# Patient Record
Sex: Female | Born: 1965 | Hispanic: Yes | Marital: Married | Smoking: Never smoker
Health system: Southern US, Community
[De-identification: ages and names within clinical notes are randomized; demographics above are authoritative.]

## PROBLEM LIST (undated history)

## (undated) DIAGNOSIS — F419 Anxiety disorder, unspecified: Secondary | ICD-10-CM

## (undated) DIAGNOSIS — I10 Essential (primary) hypertension: Secondary | ICD-10-CM

## (undated) DIAGNOSIS — Z309 Encounter for contraceptive management, unspecified: Secondary | ICD-10-CM

## (undated) DIAGNOSIS — E559 Vitamin D deficiency, unspecified: Secondary | ICD-10-CM

## (undated) HISTORY — DX: Essential (primary) hypertension: I10

## (undated) HISTORY — DX: Encounter for contraceptive management, unspecified: Z30.9

## (undated) HISTORY — DX: Anxiety disorder, unspecified: F41.9

## (undated) HISTORY — PX: OTHER SURGICAL HISTORY: SHX169

## (undated) HISTORY — DX: Vitamin D deficiency, unspecified: E55.9

---

## 2007-10-04 ENCOUNTER — Other Ambulatory Visit: Admission: RE | Admit: 2007-10-04 | Discharge: 2007-10-04 | Payer: Self-pay | Admitting: Obstetrics and Gynecology

## 2009-02-05 ENCOUNTER — Other Ambulatory Visit: Admission: RE | Admit: 2009-02-05 | Discharge: 2009-02-05 | Payer: Self-pay | Admitting: Obstetrics and Gynecology

## 2010-05-12 ENCOUNTER — Encounter: Payer: Self-pay | Admitting: Obstetrics & Gynecology

## 2011-01-23 ENCOUNTER — Other Ambulatory Visit: Payer: Self-pay | Admitting: Obstetrics and Gynecology

## 2011-02-07 ENCOUNTER — Other Ambulatory Visit: Payer: Self-pay | Admitting: Obstetrics and Gynecology

## 2011-02-07 ENCOUNTER — Other Ambulatory Visit (HOSPITAL_COMMUNITY)
Admission: RE | Admit: 2011-02-07 | Discharge: 2011-02-07 | Disposition: A | Discharge status: Home / Self Care | Payer: BC Managed Care – PPO | Source: Ambulatory Visit | Attending: Obstetrics and Gynecology | Admitting: Obstetrics and Gynecology

## 2011-02-07 DIAGNOSIS — Z01419 Encounter for gynecological examination (general) (routine) without abnormal findings: Secondary | ICD-10-CM | POA: Insufficient documentation

## 2011-02-07 DIAGNOSIS — Z139 Encounter for screening, unspecified: Secondary | ICD-10-CM

## 2011-03-03 ENCOUNTER — Ambulatory Visit (HOSPITAL_COMMUNITY)
Admission: RE | Admit: 2011-03-03 | Discharge: 2011-03-03 | Disposition: A | Discharge status: Home / Self Care | Payer: BC Managed Care – PPO | Source: Ambulatory Visit | Attending: Obstetrics and Gynecology | Admitting: Obstetrics and Gynecology

## 2011-03-03 DIAGNOSIS — Z1231 Encounter for screening mammogram for malignant neoplasm of breast: Secondary | ICD-10-CM | POA: Insufficient documentation

## 2011-03-03 DIAGNOSIS — Z139 Encounter for screening, unspecified: Secondary | ICD-10-CM

## 2012-03-22 ENCOUNTER — Other Ambulatory Visit: Payer: Self-pay | Admitting: Adult Health

## 2012-03-22 DIAGNOSIS — Z139 Encounter for screening, unspecified: Secondary | ICD-10-CM

## 2012-03-30 ENCOUNTER — Other Ambulatory Visit: Payer: Self-pay | Admitting: Adult Health

## 2012-03-30 ENCOUNTER — Other Ambulatory Visit (HOSPITAL_COMMUNITY)
Admission: RE | Admit: 2012-03-30 | Discharge: 2012-03-30 | Disposition: A | Discharge status: Home / Self Care | Payer: BC Managed Care – PPO | Source: Ambulatory Visit | Attending: Obstetrics and Gynecology | Admitting: Obstetrics and Gynecology

## 2012-03-30 ENCOUNTER — Ambulatory Visit (HOSPITAL_COMMUNITY)
Admission: RE | Admit: 2012-03-30 | Discharge: 2012-03-30 | Disposition: A | Discharge status: Home / Self Care | Payer: BC Managed Care – PPO | Source: Ambulatory Visit | Attending: Adult Health | Admitting: Adult Health

## 2012-03-30 DIAGNOSIS — Z139 Encounter for screening, unspecified: Secondary | ICD-10-CM

## 2012-03-30 DIAGNOSIS — Z01419 Encounter for gynecological examination (general) (routine) without abnormal findings: Secondary | ICD-10-CM | POA: Insufficient documentation

## 2012-03-30 DIAGNOSIS — Z1231 Encounter for screening mammogram for malignant neoplasm of breast: Secondary | ICD-10-CM | POA: Insufficient documentation

## 2012-03-30 DIAGNOSIS — Z1151 Encounter for screening for human papillomavirus (HPV): Secondary | ICD-10-CM | POA: Insufficient documentation

## 2012-08-11 ENCOUNTER — Telehealth: Payer: Self-pay | Admitting: Adult Health

## 2012-08-11 MED ORDER — NORGESTIMATE-ETH ESTRADIOL 0.25-35 MG-MCG PO TABS: 1.0000 | ORAL_TABLET | Freq: Every day | ORAL | Status: DC

## 2012-08-11 NOTE — Telephone Encounter (Signed)
Wants cheaper birth control(taking loestrin) will try sprintec, sent Rx to [facility] in [place]

## 2013-04-08 ENCOUNTER — Other Ambulatory Visit: Payer: Self-pay | Admitting: Adult Health

## 2013-04-08 DIAGNOSIS — Z139 Encounter for screening, unspecified: Secondary | ICD-10-CM

## 2013-04-11 ENCOUNTER — Encounter: Payer: Self-pay | Admitting: Adult Health

## 2013-04-11 ENCOUNTER — Ambulatory Visit (INDEPENDENT_AMBULATORY_CARE_PROVIDER_SITE_OTHER): Payer: BC Managed Care – PPO | Admitting: Adult Health

## 2013-04-11 ENCOUNTER — Ambulatory Visit (HOSPITAL_COMMUNITY)
Admission: RE | Admit: 2013-04-11 | Discharge: 2013-04-11 | Disposition: A | Discharge status: Home / Self Care | Payer: BC Managed Care – PPO | Source: Ambulatory Visit | Attending: Adult Health | Admitting: Adult Health

## 2013-04-11 VITALS — BP 118/60 | HR 72 | Ht 66.0 in | Wt 161.5 lb

## 2013-04-11 DIAGNOSIS — Z139 Encounter for screening, unspecified: Secondary | ICD-10-CM

## 2013-04-11 DIAGNOSIS — F419 Anxiety disorder, unspecified: Secondary | ICD-10-CM

## 2013-04-11 DIAGNOSIS — Z309 Encounter for contraceptive management, unspecified: Secondary | ICD-10-CM

## 2013-04-11 DIAGNOSIS — Z1231 Encounter for screening mammogram for malignant neoplasm of breast: Secondary | ICD-10-CM | POA: Insufficient documentation

## 2013-04-11 DIAGNOSIS — Z1212 Encounter for screening for malignant neoplasm of rectum: Secondary | ICD-10-CM

## 2013-04-11 DIAGNOSIS — Z01419 Encounter for gynecological examination (general) (routine) without abnormal findings: Secondary | ICD-10-CM

## 2013-04-11 HISTORY — DX: Encounter for contraceptive management, unspecified: Z30.9

## 2013-04-11 HISTORY — DX: Anxiety disorder, unspecified: F41.9

## 2013-04-11 LAB — HEMOCCULT GUIAC POC 1CARD (OFFICE): Fecal Occult Blood, POC: NEGATIVE

## 2013-04-11 MED ORDER — SERTRALINE HCL 50 MG PO TABS: ORAL_TABLET | ORAL | Status: DC

## 2013-04-11 MED ORDER — NORGESTIMATE-ETH ESTRADIOL 0.25-35 MG-MCG PO TABS: 1.0000 | ORAL_TABLET | Freq: Every day | ORAL | Status: DC

## 2013-04-11 NOTE — Progress Notes (Signed)
Patient ID: Samantha [name], female   DOB: [birth year], 47 y.o.   MRN: [id number] History of Present Illness: Samantha [name] is a 47 year old Hispanic female married in for a physical.She had a normal pap with negative HPV 03/30/12.Happy with sprintec.Got flu shot at work and had labs at work,and they were reviewed with her.   Current Medications, Allergies, Past Medical History, Past Surgical History, Family History and Social History were reviewed in [employer] record.   Past Medical History  Diagnosis Date  . Anxiety   . Contraception management 04/11/2013  . Anxiety 04/11/2013  Current outpatient prescriptions:norgestimate-ethinyl estradiol (ORTHO-CYCLEN,SPRINTEC,PREVIFEM) 0.25-35 MG-MCG tablet, Take 1 tablet by mouth daily., Disp: 1 Package, Rfl: 11;  sertraline (ZOLOFT) 50 MG tablet, Takes 1.5 pills daily, Disp: 45 tablet, Rfl: 11  Past Surgical History  Procedure Laterality Date  . Veins      Review of Systems: Patient denies any headaches, blurred vision, shortness of breath, chest pain, abdominal pain, problems with bowel movements, urination, or intercourse. No joint pain or mood swings, on zoloft.    Physical Exam:BP 118/60  Pulse 72  Ht 5\' 6"  (1.676 m)  Wt 161 lb 8 oz (73.256 kg)  BMI 26.08 kg/m2  LMP 03/17/2013 General:  Well developed, well nourished, no acute distress Skin:  Warm and dry Neck:  Midline trachea, normal thyroid Lungs; Clear to auscultation bilaterally Breast:  No dominant palpable mass, retraction, or nipple discharge,had mammogram this am Cardiovascular: Regular rate and rhythm Abdomen:  Soft, non tender, no hepatosplenomegaly Pelvic:  External genitalia is normal in appearance.  The vagina is normal in appearance.  The cervix is bulbous and everted at os..  Uterus is felt to be normal size, shape, and contour.  No adnexal masses or tenderness noted. Rectal: Good sphincter tone, no polyps,internal and external hemorrhoids felt.  Hemoccult  negative. Extremities:  No swelling or varicosities noted Psych:  No mood changes,alert and cooperative,seems happy,works at health dept.   Impression: Yearly gyn exam no pap Contraceptive management Anxiety     Plan: Refilled sprintec x 1 year  Refilled zoloft 50 mg 1.5 daily x 1 year Physical in 1 year Mammogram yearly  Labs at work Colonoscopy at 48.

## 2013-04-11 NOTE — Patient Instructions (Signed)
Physical in 1 year Mammogram yearly Labs at work Colonoscopy at 48 

## 2014-02-20 ENCOUNTER — Encounter: Payer: Self-pay | Admitting: Adult Health

## 2014-04-17 ENCOUNTER — Other Ambulatory Visit: Payer: Self-pay | Admitting: Adult Health

## 2014-04-17 ENCOUNTER — Telehealth: Payer: Self-pay | Admitting: *Deleted

## 2014-04-17 NOTE — Telephone Encounter (Signed)
Pt requesting a refill on OCP. Pt informed Cyril MourningJennifer Griffin, NP out of office today and pt has an appt with her tomorrow at 3 pm. Pt states will discuss refill with Cyril MourningJennifer Griffin, NP tomorrow at her appt.

## 2014-04-18 ENCOUNTER — Ambulatory Visit (INDEPENDENT_AMBULATORY_CARE_PROVIDER_SITE_OTHER): Payer: BC Managed Care – PPO | Admitting: Adult Health

## 2014-04-18 ENCOUNTER — Encounter: Payer: Self-pay | Admitting: Adult Health

## 2014-04-18 VITALS — BP 132/64 | HR 74 | Ht 65.0 in | Wt 167.0 lb

## 2014-04-18 DIAGNOSIS — Z1212 Encounter for screening for malignant neoplasm of rectum: Secondary | ICD-10-CM

## 2014-04-18 DIAGNOSIS — Z01419 Encounter for gynecological examination (general) (routine) without abnormal findings: Secondary | ICD-10-CM

## 2014-04-18 DIAGNOSIS — Z139 Encounter for screening, unspecified: Secondary | ICD-10-CM

## 2014-04-18 DIAGNOSIS — F419 Anxiety disorder, unspecified: Secondary | ICD-10-CM

## 2014-04-18 DIAGNOSIS — Z3041 Encounter for surveillance of contraceptive pills: Secondary | ICD-10-CM

## 2014-04-18 LAB — HEMOCCULT GUIAC POC 1CARD (OFFICE): FECAL OCCULT BLD: NEGATIVE

## 2014-04-18 MED ORDER — NORGESTIMATE-ETH ESTRADIOL 0.25-35 MG-MCG PO TABS: 1.0000 | ORAL_TABLET | Freq: Every day | ORAL | Status: DC

## 2014-04-18 MED ORDER — SERTRALINE HCL 100 MG PO TABS: 100.0000 mg | ORAL_TABLET | Freq: Every day | ORAL | Status: DC

## 2014-04-18 NOTE — Patient Instructions (Signed)
Pap and physical  Mammogram yearly Colonoscopy advised referred to Dr Burton Apleyehman Labs at work

## 2014-04-18 NOTE — Progress Notes (Signed)
Patient ID: Samantha [name], female   DOB: [birth year], 48 y.o.   MRN: [id number] History of Present Illness: Byrd HesselbachMaria is a 48 year old Hispanic female in for gyn exam.She had a normal pap with negative HPV 03/30/12.She has had  to take 2 zoloft and wants dose increased.Happy with OCs,having some hot flashes.   Current Medications, Allergies, Past Medical History, Past Surgical History, Family History and Social History were reviewed in [facility] electronic medical record.     Review of Systems: Patient denies any daily headaches, blurred vision, shortness of breath, chest pain, abdominal pain, problems with bowel movements, urination, or intercourse. No joint pain, see HPI.    Physical Exam:BP 132/64 mmHg  Pulse 74  Ht 5\' 5"  (1.651 m)  Wt 167 lb (75.751 kg)  BMI 27.79 kg/m2  LMP 04/13/2014 General:  Well developed, well nourished, no acute distress Skin:  Warm and dry Neck:  Midline trachea, normal thyroid Lungs; Clear to auscultation bilaterally Breast:  No dominant palpable mass, retraction, or nipple discharge Cardiovascular: Regular rate and rhythm Abdomen:  Soft, non tender, no hepatosplenomegaly Pelvic:  External genitalia is normal in appearance.  The vagina is normal in appearance, with some period like blood. The cervix is bulbous.  Uterus is felt to be normal size, shape, and contour.  No           adnexal masses or tenderness noted. Rectal: Good sphincter tone, no polyps, internal hemorrhoids felt and has external one.  Hemoccult negative. Extremities:  No swelling or varicosities noted Psych:  Alert and cooperative,seems happy, still working at [employer] de[employer]. And she works out regularly   Impression: Well woman gyn exam no pap Contraceptive management Anxiety     Plan: Rx zoloft 100 mg #90 1 daily with 11 refills Rx sprintec x 1 year Pap and physical in 1 year Mammogram yearly Referred to Dr Karilyn Cotaehman for screening colonoscopy Labs at work and they are good

## 2014-04-19 ENCOUNTER — Encounter (INDEPENDENT_AMBULATORY_CARE_PROVIDER_SITE_OTHER): Payer: Self-pay | Admitting: *Deleted

## 2014-07-12 ENCOUNTER — Ambulatory Visit (INDEPENDENT_AMBULATORY_CARE_PROVIDER_SITE_OTHER): Payer: BLUE CROSS/BLUE SHIELD | Admitting: Family Medicine

## 2014-07-12 ENCOUNTER — Encounter: Payer: Self-pay | Admitting: Family Medicine

## 2014-07-12 VITALS — BP 130/78 | HR 75 | Temp 97.5°F | Ht 65.5 in | Wt 169.4 lb

## 2014-07-12 DIAGNOSIS — F411 Generalized anxiety disorder: Secondary | ICD-10-CM | POA: Diagnosis not present

## 2014-07-12 MED ORDER — PHENTERMINE HCL 37.5 MG PO CAPS: 37.5000 mg | ORAL_CAPSULE | ORAL | Status: DC

## 2014-07-12 NOTE — Progress Notes (Signed)
Subjective:  Patient ID: Samantha [name], female    DOB: 11/17/1965  Age: 49 y.o. MRN: [id number]  CC: Establish Care and Weight Loss BMI 29 this AM.   HPI Samantha [name] presents for need to lose weight. She is 49 years old and overweight and concerned about risk in the future for diabetes and hypertension. She has tried multiple diets without success. She is anxious related to her weight. She is frustrated and has a negative body image this leads to sense of impending doom. It is associated with chest pains and dyspnea.  History Samantha [name] has a past medical history of Anxiety; Contraception management (04/11/2013); and Anxiety (04/11/2013).   She has past surgical history that includes veins.   Her family history includes Arthritis in her father and mother; Cancer in her brother; Hyperlipidemia in her father and mother; Hypertension in her brother, brother, father, and mother.She reports that she has never smoked. She has never used smokeless tobacco. She reports that she does not drink alcohol or use illicit drugs.  Current Outpatient Prescriptions on File Prior to Visit  Medication Sig Dispense Refill  . norgestimate-ethinyl estradiol (SPRINTEC 28) 0.25-35 MG-MCG tablet Take 1 tablet by mouth daily. 28 tablet 11   No current facility-administered medications on file prior to visit.    ROS Review of Systems  Constitutional: Negative for fever, chills, diaphoresis, appetite change, fatigue and unexpected weight change.  HENT: Negative for congestion, ear pain, hearing loss, postnasal drip, rhinorrhea, sneezing, sore throat and trouble swallowing.   Eyes: Negative for pain.  Respiratory: Negative for cough, chest tightness and shortness of breath.   Cardiovascular: Negative for chest pain and palpitations.  Gastrointestinal: Negative for nausea, vomiting, abdominal pain, diarrhea and constipation.  Genitourinary: Negative for dysuria, frequency and menstrual problem.  Musculoskeletal:  Negative for joint swelling and arthralgias.  Skin: Negative for rash.  Neurological: Negative for dizziness, weakness, numbness and headaches.  Psychiatric/Behavioral: Negative for dysphoric mood and agitation.    Objective:  BP 130/78 mmHg  Pulse 75  Temp(Src) 97.5 F (36.4 C) (Oral)  Ht 5' 5.5" (1.664 m)  Wt 169 lb 6.4 oz (76.839 kg)  BMI 27.75 kg/m2  LMP 07/06/2014  Physical Exam  Constitutional: She is oriented to person, place, and time. She appears well-developed and well-nourished. No distress.  HENT:  Head: Normocephalic and atraumatic.  Right Ear: External ear normal.  Left Ear: External ear normal.  Nose: Nose normal.  Mouth/Throat: Oropharynx is clear and moist.  Eyes: Conjunctivae and EOM are normal. Pupils are equal, round, and reactive to light.  Neck: Normal range of motion. Neck supple. No thyromegaly present.  Cardiovascular: Normal rate, regular rhythm and normal heart sounds.   No murmur heard. Pulmonary/Chest: Effort normal and breath sounds normal. No respiratory distress. She has no wheezes. She has no rales.  Abdominal: Soft. Bowel sounds are normal. She exhibits no distension. There is no tenderness.  Lymphadenopathy:    She has no cervical adenopathy.  Neurological: She is alert and oriented to person, place, and time. She has normal reflexes.  Skin: Skin is warm and dry.  Psychiatric: She has a normal mood and affect. Her behavior is normal. Judgment and thought content normal.    Assessment & Plan:   There are no diagnoses linked to this encounter. I have discontinued Ms. Chalfant's multivitamin and sertraline. I am also having her start on phentermine. Additionally, I am having her maintain her norgestimate-ethinyl estradiol.  Meds ordered this encounter  Medications  .  phentermine 37.5 MG capsule    Sig: Take 1 capsule (37.5 mg total) by mouth every morning.    Dispense:  30 capsule    Refill:  2     Follow-up: Return Phamacy appt in 1  month, for weight.  Mechele Claude, M.D.

## 2014-07-12 NOTE — Patient Instructions (Signed)
Follow up with Pharmacy in 1 month

## 2014-07-17 ENCOUNTER — Encounter: Payer: Self-pay | Admitting: Family Medicine

## 2014-08-15 ENCOUNTER — Telehealth: Payer: Self-pay | Admitting: Pharmacist Clinician (PhC)/ Clinical Pharmacy Specialist

## 2014-08-15 NOTE — Telephone Encounter (Signed)
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  Patient is doing well on phentermine with no complications.

## 2014-09-26 ENCOUNTER — Telehealth: Payer: Self-pay | Admitting: Pharmacist Clinician (PhC)/ Clinical Pharmacy Specialist

## 2014-09-26 NOTE — Telephone Encounter (Signed)
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 BP is 122/77 and Pulse is 80.  [facility] [place]

## 2014-11-07 ENCOUNTER — Other Ambulatory Visit: Payer: Self-pay | Admitting: Family Medicine

## 2014-11-07 NOTE — Telephone Encounter (Signed)
Last seen 07/12/14 Dr Darlyn ReadStacks   If approved [facility] [place]  [phone]

## 2014-11-07 NOTE — Telephone Encounter (Signed)
Called patient to tell her we treat phentermine for 3 months on and then 3 months off.  She has completed three months and done very well with her weight loss.  She has excellent exercise and dietary habits.  Encouraged her to continue following them.

## 2014-11-22 ENCOUNTER — Other Ambulatory Visit: Payer: Self-pay | Admitting: Adult Health

## 2014-11-22 DIAGNOSIS — Z1231 Encounter for screening mammogram for malignant neoplasm of breast: Secondary | ICD-10-CM

## 2014-12-22 ENCOUNTER — Ambulatory Visit (HOSPITAL_COMMUNITY)
Admission: RE | Admit: 2014-12-22 | Discharge: 2014-12-22 | Disposition: A | Discharge status: Home / Self Care | Payer: BLUE CROSS/BLUE SHIELD | Source: Ambulatory Visit | Attending: Adult Health | Admitting: Adult Health

## 2014-12-22 DIAGNOSIS — Z1231 Encounter for screening mammogram for malignant neoplasm of breast: Secondary | ICD-10-CM | POA: Insufficient documentation

## 2015-03-06 ENCOUNTER — Telehealth: Payer: Self-pay | Admitting: Family Medicine

## 2015-03-06 NOTE — Telephone Encounter (Signed)
I would be happy to see her for that thanks, WS.

## 2015-03-07 NOTE — Telephone Encounter (Signed)
Left a message, will need to schedule an office visit to discuss refills.

## 2015-03-13 ENCOUNTER — Other Ambulatory Visit: Payer: Self-pay | Admitting: Adult Health

## 2015-04-26 ENCOUNTER — Telehealth: Payer: Self-pay | Admitting: Pharmacist

## 2015-04-26 NOTE — Telephone Encounter (Signed)
Appt made with St[facility] - Patient made aware /lat

## 2015-04-27 ENCOUNTER — Encounter: Payer: Self-pay | Admitting: Adult Health

## 2015-04-27 ENCOUNTER — Ambulatory Visit (INDEPENDENT_AMBULATORY_CARE_PROVIDER_SITE_OTHER): Payer: BLUE CROSS/BLUE SHIELD | Admitting: Adult Health

## 2015-04-27 ENCOUNTER — Other Ambulatory Visit (HOSPITAL_COMMUNITY)
Admission: RE | Admit: 2015-04-27 | Discharge: 2015-04-27 | Disposition: A | Discharge status: Home / Self Care | Payer: BLUE CROSS/BLUE SHIELD | Source: Ambulatory Visit | Attending: Adult Health | Admitting: Adult Health

## 2015-04-27 VITALS — BP 149/79 | HR 68 | Ht 65.0 in | Wt 159.0 lb

## 2015-04-27 DIAGNOSIS — Z1151 Encounter for screening for human papillomavirus (HPV): Secondary | ICD-10-CM | POA: Insufficient documentation

## 2015-04-27 DIAGNOSIS — Z01419 Encounter for gynecological examination (general) (routine) without abnormal findings: Secondary | ICD-10-CM | POA: Insufficient documentation

## 2015-04-27 DIAGNOSIS — Z1211 Encounter for screening for malignant neoplasm of colon: Secondary | ICD-10-CM | POA: Diagnosis not present

## 2015-04-27 DIAGNOSIS — F419 Anxiety disorder, unspecified: Secondary | ICD-10-CM

## 2015-04-27 DIAGNOSIS — Z3041 Encounter for surveillance of contraceptive pills: Secondary | ICD-10-CM

## 2015-04-27 LAB — HEMOCCULT GUIAC POC 1CARD (OFFICE): Fecal Occult Blood, POC: NEGATIVE

## 2015-04-27 MED ORDER — BUPROPION HCL ER (SR) 150 MG PO TB12: 150.0000 mg | ORAL_TABLET | Freq: Every day | ORAL | Status: DC

## 2015-04-27 MED ORDER — NORGESTIMATE-ETH ESTRADIOL 0.25-35 MG-MCG PO TABS: 1.0000 | ORAL_TABLET | Freq: Every day | ORAL | Status: DC

## 2015-04-27 NOTE — Progress Notes (Signed)
Patient ID: Samantha [name] [name], female   DOB: 1965-08-25, 50 y.o.   MRN: [id number] History of Present Illness: Samantha [name] is a 50 year old Hispanic female,married,in for a well woman gyn exam and pap, she feels like she has gained weight and feels sluggish with zoloft and wants to change, she is happy with OCs. 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  Current Medications, Allergies, Past Medical History, Past Surgical History, Family History and Social History were reviewed in [facility] electronic medical record.     Review of Systems: Patient denies any headaches, hearing loss, fatigue, blurred vision, shortness of breath, chest pain, abdominal pain, problems with bowel movements, urination, or intercourse. No joint pain or mood swings.See HPI for positives.    Physical Exam:BP 149/79 mmHg  Pulse 68  Ht 5\' 5"  (1.651 m)  Wt 159 lb (72.122 kg)  BMI 26.46 kg/m2  LMP 04/14/2015  General:  Well developed, well nourished, no acute distress Skin:  Warm and dry Neck:  Midline trachea, normal thyroid, good ROM, no lymphadenopathy Lungs; Clear to auscultation bilaterally Breast:  No dominant palpable mass, retraction, or nipple discharge Cardiovascular: Regular rate and rhythm Abdomen:  Soft, non tender, no hepatosplenomegaly Pelvic:  External genitalia is normal in appearance, no lesions.  The vagina is normal in appearance. Urethra has no lesions or masses. The cervix is bulbous.Pap with HPV performed.  Uterus is felt to be normal size, shape, and contour.  No adnexal masses or tenderness noted.Bladder is non tender, no masses felt. Rectal: Good sphincter tone, no polyps, + hemorrhoids felt.  Hemoccult negative. Extremities/musculoskeletal:  No swelling or varicosities noted, no clubbing or cyanosis Psych:  No mood changes, alert and cooperative,seems happy Discussed stopping OCs at 50 and checking FSH.will stop zoloft and try wellbutrin, she has researched it and  thinks she wants to try it.  Impression: Well woman gyn exam with pap Contraceptive management Anxiety     Plan: Stop zloft Rx wellbutrin 150 mg SR take 1 daily #30 with 3 refills.call me in 3 months with F/U Refilled sprintec x 1 year Mammogram yearly Physical in 1 year, pap in 3 if normal  Decrease calories by 300 daily

## 2015-04-27 NOTE — Patient Instructions (Signed)
Physical in 1 year pap in 3 years Mammogram yearly Take wellbutrin daily

## 2015-05-01 ENCOUNTER — Ambulatory Visit: Payer: BLUE CROSS/BLUE SHIELD | Admitting: Family Medicine

## 2015-05-01 ENCOUNTER — Ambulatory Visit: Payer: BLUE CROSS/BLUE SHIELD | Admitting: Pharmacist Clinician (PhC)/ Clinical Pharmacy Specialist

## 2015-05-02 LAB — CYTOLOGY - PAP

## 2015-06-20 ENCOUNTER — Other Ambulatory Visit: Payer: Self-pay | Admitting: *Deleted

## 2015-06-20 DIAGNOSIS — I83811 Varicose veins of right lower extremities with pain: Secondary | ICD-10-CM

## 2015-07-05 ENCOUNTER — Ambulatory Visit: Payer: BLUE CROSS/BLUE SHIELD | Admitting: Adult Health

## 2015-07-13 ENCOUNTER — Encounter: Payer: Self-pay | Admitting: Vascular Surgery

## 2015-07-24 ENCOUNTER — Ambulatory Visit (HOSPITAL_COMMUNITY): Payer: BLUE CROSS/BLUE SHIELD

## 2015-07-24 ENCOUNTER — Encounter: Payer: BLUE CROSS/BLUE SHIELD | Admitting: Vascular Surgery

## 2015-07-27 ENCOUNTER — Encounter: Payer: Self-pay | Admitting: *Deleted

## 2015-07-27 ENCOUNTER — Encounter (INDEPENDENT_AMBULATORY_CARE_PROVIDER_SITE_OTHER): Payer: Self-pay

## 2015-07-27 ENCOUNTER — Ambulatory Visit (INDEPENDENT_AMBULATORY_CARE_PROVIDER_SITE_OTHER): Payer: BLUE CROSS/BLUE SHIELD | Admitting: Family Medicine

## 2015-07-27 ENCOUNTER — Encounter: Payer: Self-pay | Admitting: Family Medicine

## 2015-07-27 VITALS — BP 126/79 | HR 69 | Temp 98.9°F | Ht 65.0 in | Wt 167.2 lb

## 2015-07-27 DIAGNOSIS — N951 Menopausal and female climacteric states: Secondary | ICD-10-CM

## 2015-07-27 MED ORDER — PHENTERMINE HCL 37.5 MG PO TABS: 37.5000 mg | ORAL_TABLET | Freq: Every day | ORAL | Status: DC

## 2015-07-27 NOTE — Progress Notes (Signed)
Subjective:  Patient ID: Samantha [name], female    DOB: [birth year]  Age: 50 y.o. MRN: [id number]  CC: weight pain   HPIMaria [name] presents for weight gain related to age and decreased estrogen, perimenopause. We discussed weight loss techniques. She has a hard time controlling apetite   History Samantha [name] has a past medical history of Anxiety; Contraception management (04/11/2013); and Anxiety (04/11/2013).   She has past surgical history that includes veins.   Her family history includes Arthritis in her father and mother; Cancer in her brother; Hyperlipidemia in her father and mother; Hypertension in her brother, brother, father, and mother.She reports that she has never smoked. She has never used smokeless tobacco. She reports that she does not drink alcohol or use illicit drugs.    ROS Review of Systems  Constitutional: Negative for fever, activity change and appetite change.  HENT: Negative for congestion, rhinorrhea and sore throat.   Eyes: Negative for visual disturbance.  Respiratory: Negative for cough and shortness of breath.   Cardiovascular: Negative for chest pain and palpitations.  Gastrointestinal: Negative for nausea, abdominal pain and diarrhea.  Genitourinary: Negative for dysuria.  Musculoskeletal: Negative for myalgias and arthralgias.    Objective:  BP 126/79 mmHg  Pulse 69  Temp(Src) 98.9 F (37.2 C) (Oral)  Ht  (1.651 m)  Wt 167 lb 3.2 oz (75.841 kg)  BMI 27.82 kg/m2  SpO2 100%  LMP 07/17/2015  BP Readings from Last 3 Encounters:  07/27/15 126/79  04/27/15 149/79  07/12/14 130/78    Wt Readings from Last 3 Encounters:  07/27/15 167 lb 3.2 oz (75.841 kg)  04/27/15 159 lb (72.122 kg)  07/12/14 169 lb 6.4 oz (76.839 kg)     Physical Exam  Constitutional: She is oriented to person, place, and time. She appears well-developed and well-nourished. No distress.  HENT:  Head: Normocephalic and atraumatic.  Eyes: Conjunctivae are normal.  Pupils are equal, round, and reactive to light.  Neck: Normal range of motion. Neck supple. No thyromegaly present.  Cardiovascular: Normal rate, regular rhythm and normal heart sounds.   No murmur heard. Pulmonary/Chest: Effort normal and breath sounds normal. No respiratory distress. She has no wheezes. She has no rales.  Abdominal: Soft. Bowel sounds are normal.  Musculoskeletal: Normal range of motion.  Neurological: She is alert and oriented to person, place, and time.  Skin: Skin is warm and dry.  Psychiatric: She has a normal mood and affect. Her behavior is normal. Judgment and thought content normal.     No results found for: WBC, HGB, HCT, PLT, GLUCOSE, CHOL, TRIG, HDL, LDLDIRECT, LDLCALC, ALT, AST, NA, K, CL, CREATININE, BUN, CO2, TSH, PSA, INR, GLUF, HGBA1C, MICROALBUR  No results found.  Assessment & Plan:   Samantha [name] was seen today for weight pain.  Diagnoses and all orders for this visit:  Peri-menopause  Other orders -     Discontinue: phentermine (ADIPEX-P) 37.5 MG tablet; Take 1 tablet (37.5 mg total) by mouth daily before breakfast. -     phentermine (ADIPEX-P) 37.5 MG tablet; Take 1 tablet (37.5 mg total) by mouth daily before breakfast.   20 min was spent with patient. Most discussing appropriate intake of carbs, proteins and fats. Decrease fruit juice and peanut butter. Increase lean protein foods. Add resistance training to workout regimen  I am having Ms. Hottenstein maintain her norgestimate-ethinyl estradiol, buPROPion, and phentermine.  Meds ordered this encounter  Medications  . DISCONTD: phentermine (ADIPEX-P) 37.5 MG tablet    Sig:  Take 1 tablet (37.5 mg total) by mouth daily before breakfast.    Dispense:  30 tablet    Refill:  5  . phentermine (ADIPEX-P) 37.5 MG tablet    Sig: Take 1 tablet (37.5 mg total) by mouth daily before breakfast.    Dispense:  30 tablet    Refill:  5     Follow-up: Return in about 6 months (around 01/26/2016).  Mechele ClaudeWarren  Odilia Damico, M.D.

## 2015-09-04 ENCOUNTER — Telehealth: Payer: Self-pay | Admitting: Pharmacist Clinician (PhC)/ Clinical Pharmacy Specialist

## 2015-09-04 NOTE — Telephone Encounter (Signed)
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

## 2016-02-11 ENCOUNTER — Telehealth: Payer: Self-pay | Admitting: *Deleted

## 2016-02-11 NOTE — Telephone Encounter (Signed)
Patient was last here in April, she was given a 6 month supply of phentermine. She took for 3 months and took a 2 month break, went to refill now and it has run out. Please call her and let her know if you will refill

## 2016-02-12 NOTE — Telephone Encounter (Signed)
the patient will need to be seen to eval for this

## 2016-02-12 NOTE — Telephone Encounter (Signed)
Spoke with patient, appointment made for 11/10 at 1:55 pm

## 2016-02-29 ENCOUNTER — Ambulatory Visit: Payer: BLUE CROSS/BLUE SHIELD | Admitting: Family Medicine

## 2016-03-17 ENCOUNTER — Encounter: Payer: Self-pay | Admitting: Family Medicine

## 2016-03-17 ENCOUNTER — Ambulatory Visit (INDEPENDENT_AMBULATORY_CARE_PROVIDER_SITE_OTHER): Payer: BLUE CROSS/BLUE SHIELD | Admitting: Family Medicine

## 2016-03-17 VITALS — BP 129/78 | HR 65 | Temp 98.6°F | Ht 65.0 in | Wt 167.0 lb

## 2016-03-17 DIAGNOSIS — F419 Anxiety disorder, unspecified: Secondary | ICD-10-CM

## 2016-03-17 DIAGNOSIS — Z6827 Body mass index (BMI) 27.0-27.9, adult: Secondary | ICD-10-CM

## 2016-03-17 MED ORDER — PHENTERMINE HCL 37.5 MG PO TABS: 37.5000 mg | ORAL_TABLET | Freq: Every day | ORAL | 5 refills | Status: DC

## 2016-03-17 NOTE — Progress Notes (Signed)
Subjective:  Patient ID: Samantha [name], female    DOB: 1965-07-06  Age: 50 y.o. MRN: [id number]  CC: Medication Refill (pt here today for refills on her Adipex, no concerns voiced)   HPI Samantha [name] presents for Recheck of her weight. She has lost and regained her weight. She used the medicine for 3 months. Then stop for 3 months. Denies side effects such as palpitations and anxiety increase from baseline. She also has not had any problems with chest pain shortness of breath headache etc. She would like to resume her weight loss program with a goal of 145.   History Samantha [name] has a past medical history of Anxiety; Anxiety (04/11/2013); and Contraception management (04/11/2013).   She has a past surgical history that includes veins.   Her family history includes Arthritis in her father and mother; Cancer in her brother; Hyperlipidemia in her father and mother; Hypertension in her brother, brother, father, and mother.She reports that she has never smoked. She has never used smokeless tobacco. She reports that she does not drink alcohol or use drugs.    ROS Review of Systems  Constitutional: Negative for activity change, appetite change and fever.  HENT: Negative for congestion, rhinorrhea and sore throat.   Eyes: Negative for visual disturbance.  Respiratory: Negative for cough and shortness of breath.   Cardiovascular: Negative for chest pain and palpitations.  Gastrointestinal: Negative for abdominal pain, diarrhea and nausea.  Genitourinary: Negative for dysuria.  Musculoskeletal: Negative for arthralgias and myalgias.    Objective:  BP 129/78   Pulse 65   Temp 98.6 F (37 C) (Oral)   Ht 5\' 5"  (1.651 m)   Wt 167 lb (75.8 kg)   LMP 02/20/2016 (Approximate)   BMI 27.79 kg/m   BP Readings from Last 3 Encounters:  03/17/16 129/78  07/27/15 126/79  04/27/15 (!) 149/79    Wt Readings from Last 3 Encounters:  03/17/16 167 lb (75.8 kg)  07/27/15 167 lb 3.2 oz (75.8 kg)    04/27/15 159 lb (72.1 kg)     Physical Exam  Constitutional: She is oriented to person, place, and time. She appears well-developed and well-nourished. No distress.  HENT:  Head: Normocephalic and atraumatic.  Eyes: Conjunctivae are normal. Pupils are equal, round, and reactive to light.  Neck: Normal range of motion. Neck supple. No thyromegaly present.  Cardiovascular: Normal rate, regular rhythm and normal heart sounds.   No murmur heard. Pulmonary/Chest: Effort normal and breath sounds normal. No respiratory distress. She has no wheezes. She has no rales.  Abdominal: Soft. Bowel sounds are normal. There is no tenderness.  Musculoskeletal: Normal range of motion.  Lymphadenopathy:    She has no cervical adenopathy.  Neurological: She is alert and oriented to person, place, and time.  Skin: Skin is warm and dry.  Psychiatric: She has a normal mood and affect. Her behavior is normal. Judgment and thought content normal.     No results found for: WBC, HGB, HCT, PLT, GLUCOSE, CHOL, TRIG, HDL, LDLDIRECT, LDLCALC, ALT, AST, NA, K, CL, CREATININE, BUN, CO2, TSH, PSA, INR, GLUF, HGBA1C, MICROALBUR  No results found.  Assessment & Plan:   Samantha [name] was seen today for medication refill.  Diagnoses and all orders for this visit:  BMI 27.0-27.9,adult  Anxiety    I have discontinued Ms. Randal's buPROPion. I am also having her maintain her norgestimate-ethinyl estradiol and phentermine.  No orders of the defined types were placed in this encounter.    Follow-up: Return in  about 6 months (around 09/14/2016).  Mechele ClaudeWarren Deuntae Kocsis, M.D.

## 2016-05-05 ENCOUNTER — Other Ambulatory Visit: Payer: Self-pay | Admitting: Adult Health

## 2016-05-05 ENCOUNTER — Ambulatory Visit (INDEPENDENT_AMBULATORY_CARE_PROVIDER_SITE_OTHER): Payer: BLUE CROSS/BLUE SHIELD | Admitting: Adult Health

## 2016-05-05 ENCOUNTER — Encounter (INDEPENDENT_AMBULATORY_CARE_PROVIDER_SITE_OTHER): Payer: Self-pay | Admitting: *Deleted

## 2016-05-05 ENCOUNTER — Encounter: Payer: Self-pay | Admitting: Adult Health

## 2016-05-05 ENCOUNTER — Ambulatory Visit (HOSPITAL_COMMUNITY)
Admission: RE | Admit: 2016-05-05 | Discharge: 2016-05-05 | Disposition: A | Discharge status: Home / Self Care | Payer: BLUE CROSS/BLUE SHIELD | Source: Ambulatory Visit | Attending: Adult Health | Admitting: Adult Health

## 2016-05-05 ENCOUNTER — Other Ambulatory Visit: Payer: Self-pay | Admitting: Physician Assistant

## 2016-05-05 VITALS — BP 133/79 | HR 94 | Ht 65.0 in | Wt 160.0 lb

## 2016-05-05 DIAGNOSIS — Z1231 Encounter for screening mammogram for malignant neoplasm of breast: Secondary | ICD-10-CM

## 2016-05-05 DIAGNOSIS — K649 Unspecified hemorrhoids: Secondary | ICD-10-CM

## 2016-05-05 DIAGNOSIS — F419 Anxiety disorder, unspecified: Secondary | ICD-10-CM | POA: Diagnosis not present

## 2016-05-05 DIAGNOSIS — N951 Menopausal and female climacteric states: Secondary | ICD-10-CM

## 2016-05-05 DIAGNOSIS — Z1212 Encounter for screening for malignant neoplasm of rectum: Secondary | ICD-10-CM

## 2016-05-05 DIAGNOSIS — Z01411 Encounter for gynecological examination (general) (routine) with abnormal findings: Secondary | ICD-10-CM

## 2016-05-05 DIAGNOSIS — Z1211 Encounter for screening for malignant neoplasm of colon: Secondary | ICD-10-CM

## 2016-05-05 DIAGNOSIS — Z79890 Hormone replacement therapy (postmenopausal): Secondary | ICD-10-CM | POA: Insufficient documentation

## 2016-05-05 DIAGNOSIS — Z01419 Encounter for gynecological examination (general) (routine) without abnormal findings: Secondary | ICD-10-CM

## 2016-05-05 LAB — HEMOCCULT GUIAC POC 1CARD (OFFICE): FECAL OCCULT BLD: NEGATIVE

## 2016-05-05 MED ORDER — CONJ ESTROGENS-BAZEDOXIFENE 0.45-20 MG PO TABS: ORAL_TABLET | ORAL | 0 refills | Status: DC

## 2016-05-05 NOTE — Progress Notes (Signed)
Patient ID: Samantha [name], female   DOB: 1965-11-19, 51 y.o.   MRN: [id number] History of Present Illness: Samantha [name] is a 51 year old Hispanic female,married in for a well woman gyn exam, she had a normal pap with negative HPV 04/27/15.She is complaining of anxiety,hot flashes, not sleeping well, moody.She had labs at work [facility] 58.9.   Current Medications, Allergies, Past Medical History, Past Surgical History, Family History and Social History were reviewed in [facility] electronic medical record.     Review of Systems: Patient denies any headaches, hearing loss, fatigue, blurred vision, shortness of breath, chest pain, abdominal pain, problems with bowel movements, urination, or intercourse. No joint pain, see HPI for positives.    Physical Exam:BP 133/79 (BP Location: Left Arm, Patient Position: Sitting, Cuff Size: Normal)   Pulse 94   Ht 5\' 5"  (1.651 m)   Wt 160 lb (72.6 kg)   LMP 03/20/2016 (Approximate)   BMI 26.63 kg/m  General:  Well developed, well nourished, no acute distress Skin:  Warm and dry Neck:  Midline trachea, normal thyroid, good ROM, no lymphadenopathy Lungs; Clear to auscultation bilaterally Breast:  No dominant palpable mass, retraction, or nipple discharge Cardiovascular: Regular rate and rhythm Abdomen:  Soft, non tender, no hepatosplenomegaly Pelvic:  External genitalia is normal in appearance, no lesions.  The vagina is normal in appearance. Urethra has no lesions or masses. The cervix is bulbous.  Uterus is felt to be normal size, shape, and contour.  No adnexal masses or tenderness noted.Bladder is non tender, no masses felt. Rectal: Good sphincter tone, no polyps, + hemorrhoids felt.  Hemoccult negative. Extremities/musculoskeletal:  No swelling or varicosities noted, no clubbing or cyanosis Psych:  No mood changes, alert and cooperative,seems happy PHQ 2 score 0. [facility]RT [facility], aware of risks and  benefits.  Impression:  1. Well woman exam with routine gynecological exam   2. Menopausal symptoms   3. Anxiety   4. Hormone replacement therapy (HRT)   5. Screening for colorectal cancer   6. Hemorrhoids, unspecified hemorrhoid type      Plan: Meds ordered this encounter  Medications  . Conj Estrogens-Bazedoxifene (DUAVEE) 0.45-20 MG TABS    Sig: Take 1 daily    Dispense:  56 tablet    Refill:  0    Order Specific Question:   Supervising Provider    Answer:   Lazaro ArmsEURE, LUTHER H [[id number]]  Review handout on menopause Follow up in 6 weeks Physical in 1 year Mammogram yearly Referred to Dr Karilyn Cotaehman for colonoscopy

## 2016-05-05 NOTE — Patient Instructions (Signed)
Menopause Menopause is the normal time of life when menstrual periods stop completely. Menopause is complete when you have missed 12 consecutive menstrual periods. It usually occurs between the ages of 48 years and 55 years. Very rarely does a woman develop menopause before the age of 40 years. At menopause, your ovaries stop producing the female hormones estrogen and progesterone. This can cause undesirable symptoms and also affect your health. Sometimes the symptoms may occur 4-5 years before the menopause begins. There is no relationship between menopause and:  Oral contraceptives.  Number of children you had.  Race.  The age your menstrual periods started (menarche).  Heavy smokers and very thin women may develop menopause earlier in life. What are the causes?  The ovaries stop producing the female hormones estrogen and progesterone. Other causes include:  Surgery to remove both ovaries.  The ovaries stop functioning for no known reason.  Tumors of the pituitary gland in the brain.  Medical disease that affects the ovaries and hormone production.  Radiation treatment to the abdomen or pelvis.  Chemotherapy that affects the ovaries.  What are the signs or symptoms?  Hot flashes.  Night sweats.  Decrease in sex drive.  Vaginal dryness and thinning of the vagina causing painful intercourse.  Dryness of the skin and developing wrinkles.  Headaches.  Tiredness.  Irritability.  Memory problems.  Weight gain.  Bladder infections.  Hair growth of the face and chest.  Infertility. More serious symptoms include:  Loss of bone (osteoporosis) causing breaks (fractures).  Depression.  Hardening and narrowing of the arteries (atherosclerosis) causing heart attacks and strokes.  How is this diagnosed?  When the menstrual periods have stopped for 12 straight months.  Physical exam.  Hormone studies of the blood. How is this treated? There are many treatment  choices and nearly as many questions about them. The decisions to treat or not to treat menopausal changes is an individual choice made with your health care provider. Your health care provider can discuss the treatments with you. Together, you can decide which treatment will work best for you. Your treatment choices may include:  Hormone therapy (estrogen and progesterone).  Non-hormonal medicines.  Treating the individual symptoms with medicine (for example antidepressants for depression).  Herbal medicines that may help specific symptoms.  Counseling by a psychiatrist or psychologist.  Group therapy.  Lifestyle changes including: ? Eating healthy. ? Regular exercise. ? Limiting caffeine and alcohol. ? Stress management and meditation.  No treatment.  Follow these instructions at home:  Take the medicine your health care provider gives you as directed.  Get plenty of sleep and rest.  Exercise regularly.  Eat a diet that contains calcium (good for the bones) and soy products (acts like estrogen hormone).  Avoid alcoholic beverages.  Do not smoke.  If you have hot flashes, dress in layers.  Take supplements, calcium, and vitamin D to strengthen bones.  You can use over-the-counter lubricants or moisturizers for vaginal dryness.  Group therapy is sometimes very helpful.  Acupuncture may be helpful in some cases. Contact a health care provider if:  You are not sure you are in menopause.  You are having menopausal symptoms and need advice and treatment.  You are still having menstrual periods after age 55 years.  You have pain with intercourse.  Menopause is complete (no menstrual period for 12 months) and you develop vaginal bleeding.  You need a referral to a specialist (gynecologist, psychiatrist, or psychologist) for treatment. Get help right   away if:  You have severe depression.  You have excessive vaginal bleeding.  You fell and think you have a  broken bone.  You have pain when you urinate.  You develop leg or chest pain.  You have a fast pounding heart beat (palpitations).  You have severe headaches.  You develop vision problems.  You feel a lump in your breast.  You have abdominal pain or severe indigestion. This information is not intended to replace advice given to you by your health care provider. Make sure you discuss any questions you have with your health care provider. Document Released: 06/28/2003 Document Revised: 09/13/2015 Document Reviewed: 11/04/2012 Elsevier Interactive Patient Education  2017 [employer]  

## 2016-05-09 ENCOUNTER — Telehealth: Payer: Self-pay | Admitting: Adult Health

## 2016-05-09 NOTE — Telephone Encounter (Signed)
Spoke with pt. Pt is having trouble sleeping. Has noticed this since November. Can you order sleeping med? Thanks!! JSY

## 2016-05-09 NOTE — Telephone Encounter (Signed)
Pt will give HRT a chance, first

## 2016-05-12 ENCOUNTER — Telehealth: Payer: Self-pay | Admitting: Adult Health

## 2016-05-12 NOTE — Telephone Encounter (Signed)
Spoke with pt letting her know mammogram was normal. Also, pt states she hasn't heard from Dr. Patty Sermonsehman's office. Thanks!! JSY

## 2016-05-12 NOTE — Telephone Encounter (Signed)
Angie is checking on referral now

## 2016-05-13 ENCOUNTER — Encounter: Payer: Self-pay | Admitting: Adult Health

## 2016-05-15 ENCOUNTER — Other Ambulatory Visit (INDEPENDENT_AMBULATORY_CARE_PROVIDER_SITE_OTHER): Payer: Self-pay | Admitting: *Deleted

## 2016-05-15 DIAGNOSIS — Z1211 Encounter for screening for malignant neoplasm of colon: Secondary | ICD-10-CM

## 2016-06-20 ENCOUNTER — Encounter (INDEPENDENT_AMBULATORY_CARE_PROVIDER_SITE_OTHER): Payer: Self-pay | Admitting: *Deleted

## 2016-06-20 ENCOUNTER — Telehealth (INDEPENDENT_AMBULATORY_CARE_PROVIDER_SITE_OTHER): Payer: Self-pay | Admitting: *Deleted

## 2016-06-20 NOTE — Telephone Encounter (Signed)
Patient needs trilyte 

## 2016-06-23 MED ORDER — PEG 3350-KCL-NA BICARB-NACL 420 G PO SOLR: 4000.0000 mL | Freq: Once | ORAL | 0 refills | Status: AC

## 2016-06-24 ENCOUNTER — Ambulatory Visit (INDEPENDENT_AMBULATORY_CARE_PROVIDER_SITE_OTHER): Payer: BLUE CROSS/BLUE SHIELD | Admitting: Adult Health

## 2016-06-24 ENCOUNTER — Encounter: Payer: Self-pay | Admitting: Adult Health

## 2016-06-24 VITALS — BP 128/74 | HR 90 | Ht 65.0 in | Wt 156.0 lb

## 2016-06-24 DIAGNOSIS — Z6825 Body mass index (BMI) 25.0-25.9, adult: Secondary | ICD-10-CM | POA: Diagnosis not present

## 2016-06-24 DIAGNOSIS — Z79890 Hormone replacement therapy (postmenopausal): Secondary | ICD-10-CM

## 2016-06-24 DIAGNOSIS — Z713 Dietary counseling and surveillance: Secondary | ICD-10-CM

## 2016-06-24 MED ORDER — ESTRADIOL 1 MG PO TABS: 1.0000 mg | ORAL_TABLET | Freq: Every day | ORAL | 6 refills | Status: DC

## 2016-06-24 MED ORDER — PROGESTERONE MICRONIZED 200 MG PO CAPS: 200.0000 mg | ORAL_CAPSULE | Freq: Every day | ORAL | 6 refills | Status: DC

## 2016-06-24 MED ORDER — PHENTERMINE HCL 37.5 MG PO TABS: 37.5000 mg | ORAL_TABLET | Freq: Every day | ORAL | 0 refills | Status: DC

## 2016-06-24 NOTE — Progress Notes (Signed)
Subjective:     Patient ID: Samantha [name], female   DOB: [birth year], 51 y.o.   MRN: [id number]  HPI Byrd HesselbachMaria is a 51 year old [place] female in for weight check and follow up of [facility], she is better, hot flashes have stopped, less moody, but [facility], wants to try something cheaper.  Review of Systems Hot flashes have stopped Less moody Has lost 4 more lbs, for total of 15  Reviewed past medical,surgical, social and family history. Reviewed medications and allergies.     Objective:   Physical Exam BP 128/74 (BP Location: Left Arm, Patient Position: Sitting, Cuff Size: Normal)   Pulse 90   Ht 5\' 5"  (1.651 m)   Wt 156 lb (70.8 kg)   BMI 25.96 kg/m   Skin warm and dry.  Lungs: clear to ausculation bilaterally. Cardiovascular: regular rate and rhythm.   Will Rx adipex for another month and will change HRT, but if they don't work as well as [employer],let me know can do more samples and get discount from rep.Kepp up good work on weight loss and then maintaining.  Face time 15 minutes with 50 % talking and counseling.  Assessment:     1. Weight loss counseling, encounter for   2. Body mass index 25.0-25.9, adult   3. Hormone replacement therapy (HRT)       Plan:     Meds ordered this encounter  Medications  . phentermine (ADIPEX-P) 37.5 MG tablet    Sig: Take 1 tablet (37.5 mg total) by mouth daily before breakfast.    Dispense:  30 tablet    Refill:  0    Order Specific Question:   Supervising Provider    Answer:   Despina HiddenEURE, LUTHER H [[id number]]  . progesterone (PROMETRIUM) 200 MG capsule    Sig: Take 1 capsule (200 mg total) by mouth daily.    Dispense:  30 capsule    Refill:  6    Order Specific Question:   Supervising Provider    Answer:   Despina HiddenEURE, LUTHER H [[id number]]  . estradiol (ESTRACE) 1 MG tablet    Sig: Take 1 tablet (1 mg total) by mouth daily.    Dispense:  30 tablet    Refill:  6    Order Specific Question:   Supervising Provider    Answer:    Lazaro ArmsEURE, LUTHER H [[id number]]  Finish Duavee samples,then start estrace and Prometrium  Follow up prn

## 2016-07-17 ENCOUNTER — Telehealth (INDEPENDENT_AMBULATORY_CARE_PROVIDER_SITE_OTHER): Payer: Self-pay | Admitting: *Deleted

## 2016-07-17 NOTE — Telephone Encounter (Signed)
Referring MD: jennifer griffin (gyn) PCP: Roma Kayserkatie skillman, pa   Procedure: tcs  Reason/Indication:  screening  Has patient had this procedure before?  no  If so, when, by whom and where?    Is there a family history of colon cancer?  no  Who?  What age when diagnosed?    Is patient diabetic?   no      Does patient have prosthetic heart valve or mechanical valve?  no  Do you have a pacemaker?  no  Has patient ever had endocarditis? no  Has patient had joint replacement within last 12 months?  no  Does patient tend to be constipated or take laxatives? some  Does patient have a history of alcohol/drug use?  no  Is patient on Coumadin, Plavix and/or Aspirin? no  Medications: see epic  Allergies: nkda  Medication Adjustment per Dr Karilyn Cotaehman:   Procedure date & time: 08/14/16 at 730

## 2016-07-21 ENCOUNTER — Telehealth: Payer: Self-pay | Admitting: Adult Health

## 2016-07-21 NOTE — Telephone Encounter (Signed)
Spoke with pt. Pt is on Estradiol and Progesterone. Pt wiped blood today one time. Pt wonders if this is normal. Pt wonders if she should continue Estradiol and Progesterone. I spoke with JAG and she advised can have some spotting with this but needs an [place]. Continue meds for now. Pt voiced understanding. Call transferred to front desk for appt. JSY

## 2016-07-21 NOTE — Telephone Encounter (Signed)
Left message x 1. JSY 

## 2016-07-22 ENCOUNTER — Other Ambulatory Visit: Payer: Self-pay | Admitting: Adult Health

## 2016-07-22 DIAGNOSIS — N950 Postmenopausal bleeding: Secondary | ICD-10-CM

## 2016-07-22 NOTE — Telephone Encounter (Signed)
agree

## 2016-07-23 ENCOUNTER — Ambulatory Visit (INDEPENDENT_AMBULATORY_CARE_PROVIDER_SITE_OTHER): Payer: BLUE CROSS/BLUE SHIELD

## 2016-07-23 DIAGNOSIS — N950 Postmenopausal bleeding: Secondary | ICD-10-CM | POA: Diagnosis not present

## 2016-07-23 NOTE — Progress Notes (Addendum)
PELVIC US TA/TV: heterogeneous anteverted uterus,anterior fundal right subserosal fibroid 2.5 x 1.4 x 2.2 cm ,EEC 2.6 mm w/ a trace of simple fluid w/in the endometrium,normal ovaries bilat,ovaries appear mobile,no free fluid,no pain during ultrasound

## 2016-07-25 ENCOUNTER — Telehealth: Payer: Self-pay | Admitting: Adult Health

## 2016-07-25 NOTE — Telephone Encounter (Signed)
EEC was 2.6 mm not cm

## 2016-07-25 NOTE — Telephone Encounter (Signed)
Pt aware US showed 2 cm fibroid and endometrial lining was 2.6 cm, continue HRT

## 2016-07-28 ENCOUNTER — Telehealth: Payer: Self-pay | Admitting: Adult Health

## 2016-07-28 NOTE — Telephone Encounter (Signed)
Pt aware of Dr Carmin Muskrat recommendation for sonohysterogram  and possible biopsy, appt made

## 2016-08-14 ENCOUNTER — Encounter (HOSPITAL_COMMUNITY)
Admission: RE | Disposition: A | Discharge status: Home / Self Care | Payer: Self-pay | Source: Ambulatory Visit | Attending: Internal Medicine

## 2016-08-14 ENCOUNTER — Encounter (HOSPITAL_COMMUNITY): Payer: Self-pay

## 2016-08-14 ENCOUNTER — Ambulatory Visit (HOSPITAL_COMMUNITY)
Admission: RE | Admit: 2016-08-14 | Discharge: 2016-08-14 | Disposition: A | Discharge status: Home / Self Care | Payer: BLUE CROSS/BLUE SHIELD | Source: Ambulatory Visit | Attending: Internal Medicine | Admitting: Internal Medicine

## 2016-08-14 DIAGNOSIS — K644 Residual hemorrhoidal skin tags: Secondary | ICD-10-CM | POA: Insufficient documentation

## 2016-08-14 DIAGNOSIS — K5730 Diverticulosis of large intestine without perforation or abscess without bleeding: Secondary | ICD-10-CM | POA: Insufficient documentation

## 2016-08-14 DIAGNOSIS — Z1211 Encounter for screening for malignant neoplasm of colon: Secondary | ICD-10-CM | POA: Insufficient documentation

## 2016-08-14 DIAGNOSIS — Z79890 Hormone replacement therapy (postmenopausal): Secondary | ICD-10-CM | POA: Insufficient documentation

## 2016-08-14 DIAGNOSIS — K648 Other hemorrhoids: Secondary | ICD-10-CM | POA: Insufficient documentation

## 2016-08-14 DIAGNOSIS — Z79899 Other long term (current) drug therapy: Secondary | ICD-10-CM | POA: Diagnosis not present

## 2016-08-14 HISTORY — PX: COLONOSCOPY: SHX5424

## 2016-08-14 SURGERY — COLONOSCOPY
Anesthesia: Moderate Sedation

## 2016-08-14 MED ORDER — PROMETHAZINE HCL 25 MG/ML IJ SOLN
INTRAMUSCULAR | Status: DC | PRN
  Administered 2016-08-14: 6.25 mg via INTRAVENOUS

## 2016-08-14 MED ORDER — SODIUM CHLORIDE 0.9 % IV SOLN
INTRAVENOUS | Status: DC
  Administered 2016-08-14: 1000 mL via INTRAVENOUS

## 2016-08-14 MED ORDER — MIDAZOLAM HCL 5 MG/5ML IJ SOLN
INTRAMUSCULAR | Status: DC | PRN
  Administered 2016-08-14: 2 mg via INTRAVENOUS
  Administered 2016-08-14: 3 mg via INTRAVENOUS
  Administered 2016-08-14: 2 mg via INTRAVENOUS

## 2016-08-14 MED ORDER — SIMETHICONE 40 MG/0.6ML PO SUSP
ORAL | Status: AC
  Filled 2016-08-14: qty 30

## 2016-08-14 MED ORDER — MEPERIDINE HCL 50 MG/ML IJ SOLN
INTRAMUSCULAR | Status: AC
  Filled 2016-08-14: qty 1

## 2016-08-14 MED ORDER — PROMETHAZINE HCL 25 MG/ML IJ SOLN
INTRAMUSCULAR | Status: AC
  Filled 2016-08-14: qty 1

## 2016-08-14 MED ORDER — MIDAZOLAM HCL 5 MG/5ML IJ SOLN
INTRAMUSCULAR | Status: AC
  Filled 2016-08-14: qty 10

## 2016-08-14 MED ORDER — MEPERIDINE HCL 50 MG/ML IJ SOLN
INTRAMUSCULAR | Status: DC | PRN
  Administered 2016-08-14 (×2): 25 mg via INTRAVENOUS

## 2016-08-14 NOTE — Op Note (Signed)
[facility] Patient Name: Samantha [name] Procedure Date: 08/14/2016 7:02 AM MRN: [id number] Date of Birth: Jan 06, 1966 Attending MD: Lionel December , MD CSN: [id number] Age: 51 Admit Type: Outpatient Procedure:                Colonoscopy Indications:              Screening for colorectal malignant neoplasm Providers:                Lionel December, MD, Toniann Fail RN, RN, Dyann Ruddle Referring MD:             Cyril Mourning, NP and Roma Kayser, [facility] Medicines:                Promethazine 6.25 mg IV, Meperidine 50 mg IV,                            Midazolam 7 mg IV Complications:            No immediate complications. Estimated Blood Loss:     Estimated blood loss: none. Procedure:                Pre-Anesthesia Assessment:                           - Prior to the procedure, a History and Physical                            was performed, and patient medications and                            allergies were reviewed. The patient's tolerance of                            previous anesthesia was also reviewed. The risks                            and benefits of the procedure and the sedation                            options and risks were discussed with the patient.                            All questions were answered, and informed consent                            was obtained. Prior Anticoagulants: The patient has                            taken no previous anticoagulant or antiplatelet                            agents. ASA Grade Assessment: I - A normal, healthy  patient. After reviewing the risks and benefits,                            the patient was deemed in satisfactory condition to                            undergo the procedure.                           After obtaining informed consent, the colonoscope                            was passed under direct vision. Throughout the   procedure, the patient's blood pressure, pulse, and                            oxygen saturations were monitored continuously. The                            [id number] ([id number]) scope was introduced through                            the anus and advanced to the the cecum, identified                            by appendiceal orifice and ileocecal valve. The                            colonoscopy was performed without difficulty. The                            patient tolerated the procedure well. The quality                            of the bowel preparation was good. The ileocecal                            valve, appendiceal orifice, and rectum were                            photographed. Scope In: 7:43:01 AM Scope Out: 8:00:53 AM Scope Withdrawal Time: 0 hours 8 minutes 51 seconds  Total Procedure Duration: 0 hours 17 minutes 52 seconds  Findings:      The perianal exam findings include skin tags.      The digital rectal exam was normal.      The descending colon, splenic flexure, transverse colon, hepatic       flexure, ascending colon, cecum, appendiceal orifice and ileocecal valve       appeared normal.      A few small-mouthed diverticula were found in the sigmoid colon.      External and internal hemorrhoids were found during retroflexion. The       hemorrhoids were small. Impression:               - Perianal skin tags found on perianal exam.                           -  The descending colon, splenic flexure, transverse                            colon, hepatic flexure, ascending colon, cecum,                            appendiceal orifice and ileocecal valve are normal.                           - Diverticulosis in the sigmoid colon.                           - External and internal hemorrhoids.                           - No specimens collected. Moderate Sedation:      Moderate (conscious) sedation was administered by the endoscopy nurse       and supervised by the  endoscopist. The following parameters were       monitored: oxygen saturation, heart rate, blood pressure, CO2       capnography and response to care. Total physician intraservice time was       26 minutes. Recommendation:           - Patient has a contact number available for                            emergencies. The signs and symptoms of potential                            delayed complications were discussed with the                            patient. Return to normal activities tomorrow.                            Written discharge instructions were provided to the                            patient.                           - High fiber diet today.                           - Continue present medications.                           - Repeat colonoscopy in 10 years for screening                            purposes. Procedure Code(s):        --- Professional ---                           [phone], Colonoscopy, flexible; diagnostic, including  collection of specimen(s) by brushing or washing,                            when performed (separate procedure)                           99152, Moderate sedation services provided by the                            same physician or other qualified health care                            professional performing the diagnostic or                            therapeutic service that the sedation supports,                            requiring the presence of an independent trained                            observer to assist in the monitoring of the                            patient's level of consciousness and physiological                            status; initial 15 minutes of intraservice time,                            patient age 44 years or older                           [phone], Moderate sedation services; each additional                            15 minutes intraservice time Diagnosis Code(s):        ---  Professional ---                           Z12.11, Encounter for screening for malignant                            neoplasm of colon                           K64.8, Other hemorrhoids                           K64.4, Residual hemorrhoidal skin tags                           K57.30, Diverticulosis of large intestine without                            perforation or abscess without  bleeding CPT copyright 2016 [employer]. All rights reserved. The codes documented in this report are preliminary and upon coder review may  be revised to meet current compliance requirements. Lionel December, MD Lionel December, MD 08/14/2016 8:10:05 AM This report has been signed electronically. Number of Addenda: 0

## 2016-08-14 NOTE — Discharge Instructions (Signed)
Resume usual medications and high fiber diet. No driving for 24 hours. Next screening exam in 10 years.   Colonoscopy, Adult, Care After This sheet gives you information about how to care for yourself after your procedure. Your doctor may also give you more specific instructions. If you have problems or questions, call your doctor. Follow these instructions at home: General instructions    For the first 24 hours after the procedure:  Do not drive or use machinery.  Do not sign important documents.  Do not drink alcohol.  Do your daily activities more slowly than normal.  Eat foods that are soft and easy to digest.  Rest often.  Take over-the-counter or prescription medicines only as told by your doctor.  It is up to you to get the results of your procedure. Ask your doctor, or the department performing the procedure, when your results will be ready. To help cramping and bloating:   Try walking around.  Put heat on your belly (abdomen) as told by your doctor. Use a heat source that your doctor recommends, such as a moist heat pack or a heating pad.  Put a towel between your skin and the heat source.  Leave the heat on for 20-30 minutes.  Remove the heat if your skin turns bright red. This is especially important if you cannot feel pain, heat, or cold. You can get burned. Eating and drinking   Drink enough fluid to keep your pee (urine) clear or pale yellow.  Return to your normal diet as told by your doctor. Avoid heavy or fried foods that are hard to digest.  Avoid drinking alcohol for as long as told by your doctor. Contact a doctor if:  You have blood in your poop (stool) 2-3 days after the procedure. Get help right away if:  You have more than a small amount of blood in your poop.  You see large clumps of tissue (blood clots) in your poop.  Your belly is swollen.  You feel sick to your stomach (nauseous).  You throw up (vomit).  You have a fever.  You  have belly pain that gets worse, and medicine does not help your pain. This information is not intended to replace advice given to you by your health care provider. Make sure you discuss any questions you have with your health care provider. Document Released: 05/10/2010 Document Revised: 12/31/2015 Document Reviewed: 12/31/2015 Elsevier Interactive Patient Education  2017 Elsevier Inc.  High-Fiber Diet Fiber, also called dietary fiber, is a type of carbohydrate found in fruits, vegetables, whole grains, and beans. A high-fiber diet can have many health benefits. Your health care provider may recommend a high-fiber diet to help:  Prevent constipation. Fiber can make your bowel movements more regular.  Lower your cholesterol.  Relieve hemorrhoids, uncomplicated diverticulosis, or irritable bowel syndrome.  Prevent overeating as part of a weight-loss plan.  Prevent heart disease, type 2 diabetes, and certain cancers. What is my plan? The recommended daily intake of fiber includes:  38 grams for men under age 39.  30 grams for men over age 18.  25 grams for women under age 22.  21 grams for women over age 45. You can get the recommended daily intake of dietary fiber by eating a variety of fruits, vegetables, grains, and beans. Your health care provider may also recommend a fiber supplement if it is not possible to get enough fiber through your diet. What do I need to know about a high-fiber diet?  Fiber supplements have not been widely studied for their effectiveness, so it is better to get fiber through food sources.  Always check the fiber content on thenutrition facts label of any prepackaged food. Look for foods that contain at least 5 grams of fiber per serving.  Ask your dietitian if you have questions about specific foods that are related to your condition, especially if those foods are not listed in the following section.  Increase your daily fiber consumption gradually.  Increasing your intake of dietary fiber too quickly may cause bloating, cramping, or gas.  Drink plenty of water. Water helps you to digest fiber. What foods can I eat? Grains  Whole-grain breads. Multigrain cereal. Oats and oatmeal. Brown rice. Barley. Bulgur wheat. Millet. Bran muffins. Popcorn. Rye wafer crackers. Vegetables  Sweet potatoes. Spinach. Kale. Artichokes. Cabbage. Broccoli. Green peas. Carrots. Squash. Fruits  Berries. Pears. Apples. Oranges. Avocados. Prunes and raisins. Dried figs. Meats and Other Protein Sources  Navy, kidney, pinto, and soy beans. Split peas. Lentils. Nuts and seeds. Dairy  Fiber-fortified yogurt. Beverages  Fiber-fortified soy milk. Fiber-fortified orange juice. Other  Fiber bars. The items listed above may not be a complete list of recommended foods or beverages. Contact your dietitian for more options.  What foods are not recommended? Grains  White bread. Pasta made with refined flour. White rice. Vegetables  Fried potatoes. Canned vegetables. Well-cooked vegetables. Fruits  Fruit juice. Cooked, strained fruit. Meats and Other Protein Sources  Fatty cuts of meat. Fried Environmental education officer or fried fish. Dairy  Milk. Yogurt. Cream cheese. Sour cream. Beverages  Soft drinks. Other  Cakes and pastries. Butter and oils. The items listed above may not be a complete list of foods and beverages to avoid. Contact your dietitian for more information.  What are some tips for including high-fiber foods in my diet?  Eat a wide variety of high-fiber foods.  Make sure that half of all grains consumed each day are whole grains.  Replace breads and cereals made from refined flour or white flour with whole-grain breads and cereals.  Replace white rice with brown rice, bulgur wheat, or millet.  Start the day with a breakfast that is high in fiber, such as a cereal that contains at least 5 grams of fiber per serving.  Use beans in place of meat in soups,  salads, or pasta.  Eat high-fiber snacks, such as berries, raw vegetables, nuts, or popcorn. This information is not intended to replace advice given to you by your health care provider. Make sure you discuss any questions you have with your health care provider. Document Released: 04/07/2005 Document Revised: 09/13/2015 Document Reviewed: 09/20/2013 Elsevier Interactive Patient Education  2017 Elsevier Inc.  Diverticulosis Diverticulosis is a condition that develops when small pouches (diverticula) form in the wall of the large intestine (colon). The colon is where water is absorbed and stool is formed. The pouches form when the inside layer of the colon pushes through weak spots in the outer layers of the colon. You may have a few pouches or many of them. What are the causes? The cause of this condition is not known. What increases the risk? The following factors may make you more likely to develop this condition:  Being older than age 28. Your risk for this condition increases with age. Diverticulosis is rare among people younger than age 63. By age 63, many people have it.  Eating a low-fiber diet.  Having frequent constipation.  Being overweight.  Not getting enough exercise.  Smoking.  Taking over-the-counter pain medicines, like aspirin and ibuprofen.  Having a family history of diverticulosis. What are the signs or symptoms? In most people, there are no symptoms of this condition. If you do have symptoms, they may include:  Bloating.  Cramps in the abdomen.  Constipation or diarrhea.  Pain in the lower left side of the abdomen. How is this diagnosed? This condition is most often diagnosed during an exam for other colon problems. Because diverticulosis usually has no symptoms, it often cannot be diagnosed independently. This condition may be diagnosed by:  Using a flexible scope to examine the colon (colonoscopy).  Taking an X-ray of the colon after dye has been  put into the colon (barium enema).  Doing a CT scan. How is this treated? You may not need treatment for this condition if you have never developed an infection related to diverticulosis. If you have had an infection before, treatment may include:  Eating a high-fiber diet. This may include eating more fruits, vegetables, and grains.  Taking a fiber supplement.  Taking a live bacteria supplement (probiotic).  Taking medicine to relax your colon.  Taking antibiotic medicines. Follow these instructions at home:  Drink 6-8 glasses of water or more each day to prevent constipation.  Try not to strain when you have a bowel movement.  If you have had an infection before:  Eat more fiber as directed by your health care provider or your diet and nutrition specialist (dietitian).  Take a fiber supplement or probiotic, if your health care provider approves.  Take over-the-counter and prescription medicines only as told by your health care provider.  If you were prescribed an antibiotic, take it as told by your health care provider. Do not stop taking the antibiotic even if you start to feel better.  Keep all follow-up visits as told by your health care provider. This is important. Contact a health care provider if:  You have pain in your abdomen.  You have bloating.  You have cramps.  You have not had a bowel movement in 3 days. Get help right away if:  Your pain gets worse.  Your bloating becomes very bad.  You have a fever or chills, and your symptoms suddenly get worse.  You vomit.  You have bowel movements that are bloody or black.  You have bleeding from your rectum. Summary  Diverticulosis is a condition that develops when small pouches (diverticula) form in the wall of the large intestine (colon).  You may have a few pouches or many of them.  This condition is most often diagnosed during an exam for other colon problems.  If you have had an infection related  to diverticulosis, treatment may include increasing the fiber in your diet, taking supplements, or taking medicines. This information is not intended to replace advice given to you by your health care provider. Make sure you discuss any questions you have with your health care provider. Document Released: 01/03/2004 Document Revised: 02/25/2016 Document Reviewed: 02/25/2016 Elsevier Interactive Patient Education  2017 Elsevier Inc.  Hemorrhoids Hemorrhoids are swollen veins in and around the rectum or anus. There are two types of hemorrhoids:  Internal hemorrhoids. These occur in the veins that are just inside the rectum. They may poke through to the outside and become irritated and painful.  External hemorrhoids. These occur in the veins that are outside of the anus and can be felt as a painful swelling or hard lump near the anus. Most hemorrhoids do not  cause serious problems, and they can be managed with home treatments such as diet and lifestyle changes. If home treatments do not help your symptoms, procedures can be done to shrink or remove the hemorrhoids. What are the causes? This condition is caused by increased pressure in the anal area. This pressure may result from various things, including:  Constipation.  Straining to have a bowel movement.  Diarrhea.  Pregnancy.  Obesity.  Sitting for long periods of time.  Heavy lifting or other activity that causes you to strain.  Anal sex. What are the signs or symptoms? Symptoms of this condition include:  Pain.  Anal itching or irritation.  Rectal bleeding.  Leakage of stool (feces).  Anal swelling.  One or more lumps around the anus. How is this diagnosed? This condition can often be diagnosed through a visual exam. Other exams or tests may also be done, such as:  Examination of the rectal area with a gloved hand (digital rectal exam).  Examination of the anal canal using a small tube (anoscope).  A blood test,  if you have lost a significant amount of blood.  A test to look inside the colon (sigmoidoscopy or colonoscopy). How is this treated? This condition can usually be treated at home. However, various procedures may be done if dietary changes, lifestyle changes, and other home treatments do not help your symptoms. These procedures can help make the hemorrhoids smaller or remove them completely. Some of these procedures involve surgery, and others do not. Common procedures include:  Rubber band ligation. Rubber bands are placed at the base of the hemorrhoids to cut off the blood supply to them.  Sclerotherapy. Medicine is injected into the hemorrhoids to shrink them.  Infrared coagulation. A type of light energy is used to get rid of the hemorrhoids.  Hemorrhoidectomy surgery. The hemorrhoids are surgically removed, and the veins that supply them are tied off.  Stapled hemorrhoidopexy surgery. A circular stapling device is used to remove the hemorrhoids and use staples to cut off the blood supply to them. Follow these instructions at home: Eating and drinking   Eat foods that have a lot of fiber in them, such as whole grains, beans, nuts, fruits, and vegetables. Ask your health care provider about taking products that have added fiber (fiber supplements).  Drink enough fluid to keep your urine clear or pale yellow. Managing pain and swelling   Take warm sitz baths for 20 minutes, 3-4 times a day to ease pain and discomfort.  If directed, apply ice to the affected area. Using ice packs between sitz baths may be helpful.  Put ice in a plastic bag.  Place a towel between your skin and the bag.  Leave the ice on for 20 minutes, 2-3 times a day. General instructions   Take over-the-counter and prescription medicines only as told by your health care provider.  Use medicated creams or suppositories as told.  Exercise regularly.  Go to the bathroom when you have the urge to have a bowel  movement. Do not wait.  Avoid straining to have bowel movements.  Keep the anal area dry and clean. Use wet toilet paper or moist towelettes after a bowel movement.  Do not sit on the toilet for long periods of time. This increases blood pooling and pain. Contact a health care provider if:  You have increasing pain and swelling that are not controlled by treatment or medicine.  You have uncontrolled bleeding.  You have difficulty having a bowel  movement, or you are unable to have a bowel movement.  You have pain or inflammation outside the area of the hemorrhoids. This information is not intended to replace advice given to you by your health care provider. Make sure you discuss any questions you have with your health care provider. Document Released: 04/04/2000 Document Revised: 09/05/2015 Document Reviewed: 12/20/2014 Elsevier Interactive Patient Education  2017 [employer].

## 2016-08-14 NOTE — H&P (Signed)
Samantha [name] is an 51 y.o. female.   Chief Complaint: Patient is here for colonoscopy. HPI: Patient is 51 year old Caucasian female who is here for screening colonoscopy. She denies abdominal pain or recent change in bowel habits. She has hemorrhoids and noticed fluttering ongoing. She denies frank bleeding. Family history is negative for CRC.  Past Medical History:  Diagnosis Date  . Anxiety   . Anxiety 04/11/2013  . Contraception management 04/11/2013    Past Surgical History:  Procedure Laterality Date  . veins      Family History  Problem Relation Age of Onset  . Arthritis Mother   . Hypertension Mother   . Hyperlipidemia Mother   . Arthritis Father   . Hypertension Father   . Hyperlipidemia Father   . Cancer Brother   . Hypertension Brother   . Hypertension Brother    Social History:  reports that she has never smoked. She has never used smokeless tobacco. She reports that she does not drink alcohol or use drugs.  Allergies: No Known Allergies  Medications Prior to Admission  Medication Sig Dispense Refill  . estradiol (ESTRACE) 1 MG tablet Take 1 tablet (1 mg total) by mouth daily. 30 tablet 6  . ibuprofen (ADVIL,MOTRIN) 800 MG tablet Take 800 mg by mouth 2 (two) times daily as needed for headache or moderate pain.    . phentermine (ADIPEX-P) 37.5 MG tablet Take 1 tablet (37.5 mg total) by mouth daily before breakfast. 30 tablet 0  . progesterone (PROMETRIUM) 200 MG capsule Take 1 capsule (200 mg total) by mouth daily. (Patient taking differently: Take 200 mg by mouth every evening. ) 30 capsule 6  . valACYclovir (VALTREX) 1000 MG tablet Take 1,000 mg by mouth 2 (two) times daily as needed (fever blisters).   0    No results found for this or any previous visit (from the past 48 hour(s)). No results found.  ROS  Blood pressure (!) 147/77, pulse 67, temperature 98.6 F (37 C), temperature source Oral, resp. rate 13, SpO2 96 %. Physical Exam  Constitutional: She  appears well-developed and well-nourished.  HENT:  Mouth/Throat: Oropharynx is clear and moist.  Eyes: Conjunctivae are normal. No scleral icterus.  Neck: No thyromegaly present.  Cardiovascular: Normal rate, regular rhythm and normal heart sounds.   No murmur heard. Respiratory: Effort normal and breath sounds normal.  GI: Soft. She exhibits no distension. There is no tenderness.  Musculoskeletal: She exhibits no edema.  Lymphadenopathy:    She has no cervical adenopathy.  Neurological: She is alert.  Skin: Skin is warm and dry.     Assessment/Plan Average risk screening colonoscopy.  Lionel December, MD 08/14/2016, 7:31 AM

## 2016-08-18 ENCOUNTER — Encounter (HOSPITAL_COMMUNITY): Payer: Self-pay | Admitting: Internal Medicine

## 2016-08-20 ENCOUNTER — Other Ambulatory Visit: Payer: BLUE CROSS/BLUE SHIELD

## 2016-08-20 ENCOUNTER — Ambulatory Visit: Payer: BLUE CROSS/BLUE SHIELD | Admitting: Obstetrics and Gynecology

## 2017-02-10 ENCOUNTER — Other Ambulatory Visit: Payer: Self-pay | Admitting: Adult Health

## 2017-05-11 ENCOUNTER — Ambulatory Visit (INDEPENDENT_AMBULATORY_CARE_PROVIDER_SITE_OTHER): Payer: Commercial Managed Care - PPO | Admitting: Adult Health

## 2017-05-11 ENCOUNTER — Encounter: Payer: Self-pay | Admitting: Adult Health

## 2017-05-11 VITALS — BP 118/70 | HR 89 | Resp 16 | Ht 65.0 in | Wt 159.0 lb

## 2017-05-11 DIAGNOSIS — Z01411 Encounter for gynecological examination (general) (routine) with abnormal findings: Secondary | ICD-10-CM | POA: Diagnosis not present

## 2017-05-11 DIAGNOSIS — Z1212 Encounter for screening for malignant neoplasm of rectum: Secondary | ICD-10-CM | POA: Diagnosis not present

## 2017-05-11 DIAGNOSIS — Z1211 Encounter for screening for malignant neoplasm of colon: Secondary | ICD-10-CM

## 2017-05-11 DIAGNOSIS — Z01419 Encounter for gynecological examination (general) (routine) without abnormal findings: Secondary | ICD-10-CM

## 2017-05-11 DIAGNOSIS — F419 Anxiety disorder, unspecified: Secondary | ICD-10-CM

## 2017-05-11 DIAGNOSIS — Z79890 Hormone replacement therapy (postmenopausal): Secondary | ICD-10-CM

## 2017-05-11 LAB — HEMOCCULT GUIAC POC 1CARD (OFFICE): Fecal Occult Blood, POC: NEGATIVE

## 2017-05-11 MED ORDER — SERTRALINE HCL 50 MG PO TABS
50.0000 mg | ORAL_TABLET | Freq: Every day | ORAL | 4 refills | Status: DC
Start: 2017-05-11 — End: 2018-05-14

## 2017-05-11 MED ORDER — PROGESTERONE MICRONIZED 200 MG PO CAPS
200.0000 mg | ORAL_CAPSULE | Freq: Every day | ORAL | 4 refills | Status: DC
Start: 2017-05-11 — End: 2018-06-28

## 2017-05-11 MED ORDER — ESTRADIOL 1 MG PO TABS: 1.0000 mg | ORAL_TABLET | Freq: Every day | ORAL | 4 refills | Status: DC

## 2017-05-11 NOTE — Progress Notes (Signed)
Patient ID: Samantha StanfordMaria [name], female   DOB: 04-21-1966, 52 y.o.   MRN: [id number] History of Present Illness:  Samantha [name] is a 52 year old Hispanic female, married in for well woman gyn exam,she had normal pap with negative HPV 04/27/15. 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  Current Medications, Allergies, Past Medical History, Past Surgical History, Family History and Social History were reviewed in [facility] electronic medical record.     Review of Systems:  Patient denies any headaches, hearing loss, fatigue, blurred vision, shortness of breath, chest pain, abdominal pain, problems with bowel movements, urination, or intercourse. No joint pain or mood swings. Increased anxiety, son is Research scientist (medical)finishing college and wants to live in [place] his last year for clinicals.    Physical Exam:BP 118/70 (BP Location: Left Arm, Patient Position: Sitting, Cuff Size: Normal)   Pulse 89   Resp 16   Ht 5\' 5"  (1.651 m)   Wt 159 lb (72.1 kg)   BMI 26.46 kg/m  General:  Well developed, well nourished, no acute distress Skin:  Warm and dry Neck:  Midline trachea, normal thyroid, good ROM, no lymphadenopathy Lungs; Clear to auscultation bilaterally Breast:  No dominant palpable mass, retraction, or nipple discharge Cardiovascular: Regular rate and rhythm Abdomen:  Soft, non tender, no hepatosplenomegaly Pelvic:  External genitalia is normal in appearance, no lesions.  The vagina is normal in appearance. Urethra has no lesions or masses. The cervix is bulbous.  Uterus is felt to be normal size, shape, and contour.  No adnexal masses or tenderness noted.Bladder is non tender, no masses felt. Rectal: Good sphincter tone, no polyps, +hemorrhoids felt.  Hemoccult negative. Extremities/musculoskeletal:  No swelling or varicosities noted, no clubbing or cyanosis Psych:  No mood changes, alert and cooperative,seems happy PHQ 2 score 0.  Impression: 1. Well woman exam with routine gynecological exam   2. Anxiety   3.  Hormone replacement therapy (HRT)   4. Screening for colorectal cancer       Plan: Meds ordered this encounter  Medications  . estradiol (ESTRACE) 1 MG tablet    Sig: Take 1 tablet (1 mg total) by mouth daily.    Dispense:  90 tablet    Refill:  4    Please consider 90 day supplies to promote better adherence    Order Specific Question:   Supervising Provider    Answer:   Despina HiddenEURE, LUTHER H [[id number]]  . progesterone (PROMETRIUM) 200 MG capsule    Sig: Take 1 capsule (200 mg total) by mouth daily.    Dispense:  90 capsule    Refill:  4    Please consider 90 day supplies to promote better adherence    Order Specific Question:   Supervising Provider    Answer:   Despina HiddenEURE, LUTHER H [[id number]]  . sertraline (ZOLOFT) 50 MG tablet    Sig: Take 1 tablet (50 mg total) by mouth daily.    Dispense:  90 tablet    Refill:  4    Order Specific Question:   Supervising Provider    Answer:   Duane LopeEURE, LUTHER H [[id number]]  Pap and physical in 1 year Mammogram yearly Labs at [facility]

## 2018-05-14 ENCOUNTER — Other Ambulatory Visit (HOSPITAL_COMMUNITY)
Admission: RE | Admit: 2018-05-14 | Discharge: 2018-05-14 | Disposition: A | Discharge status: Home / Self Care | Payer: Commercial Managed Care - PPO | Source: Ambulatory Visit | Attending: Adult Health | Admitting: Adult Health

## 2018-05-14 ENCOUNTER — Encounter: Payer: Self-pay | Admitting: Adult Health

## 2018-05-14 ENCOUNTER — Ambulatory Visit (INDEPENDENT_AMBULATORY_CARE_PROVIDER_SITE_OTHER): Payer: Commercial Managed Care - PPO | Admitting: Adult Health

## 2018-05-14 VITALS — BP 127/77 | HR 70 | Ht 65.0 in | Wt 165.0 lb

## 2018-05-14 DIAGNOSIS — Z01419 Encounter for gynecological examination (general) (routine) without abnormal findings: Secondary | ICD-10-CM

## 2018-05-14 DIAGNOSIS — Z1212 Encounter for screening for malignant neoplasm of rectum: Secondary | ICD-10-CM | POA: Diagnosis not present

## 2018-05-14 DIAGNOSIS — Z6827 Body mass index (BMI) 27.0-27.9, adult: Secondary | ICD-10-CM | POA: Insufficient documentation

## 2018-05-14 DIAGNOSIS — Z1211 Encounter for screening for malignant neoplasm of colon: Secondary | ICD-10-CM | POA: Diagnosis not present

## 2018-05-14 DIAGNOSIS — Z713 Dietary counseling and surveillance: Secondary | ICD-10-CM

## 2018-05-14 DIAGNOSIS — Z79890 Hormone replacement therapy (postmenopausal): Secondary | ICD-10-CM

## 2018-05-14 DIAGNOSIS — K649 Unspecified hemorrhoids: Secondary | ICD-10-CM

## 2018-05-14 LAB — HEMOCCULT GUIAC POC 1CARD (OFFICE): Fecal Occult Blood, POC: NEGATIVE

## 2018-05-14 MED ORDER — PHENTERMINE HCL 37.5 MG PO TABS: 37.5000 mg | ORAL_TABLET | Freq: Every day | ORAL | 0 refills | Status: DC

## 2018-05-14 NOTE — Progress Notes (Signed)
Patient ID: Samantha [name], female   DOB: 07/23/1965, 53 y.o.   MRN: [id number] History of Present Illness:  Samantha [name] is a 53 year old Hispanic female,married, PM, who works at [employer], in for well woman gyn exam and pap. PCP is [facility].   Current Medications, Allergies, Past Medical History, Past Surgical History, Family History and Social History were reviewed in [facility] record.     Review of Systems:  Patient denies any headaches, hearing loss, fatigue, blurred vision, shortness of breath, chest pain, abdominal pain, problems with  urination, or intercourse. No joint pain or mood swings. Has constipation at times, and will see bright red blood, no pain, has hemorrhoids. She is happy with HRT, but has stopped zoloft, not anxious.She wants to try adipex again, has gained a little weight.  Physical Exam:BP 127/77 (BP Location: Left Arm, Patient Position: Sitting, Cuff Size: Normal)   Pulse 70   Ht 5\' 5"  (1.651 m)   Wt 165 lb (74.8 kg)   LMP 03/20/2016 (Approximate)   BMI 27.46 kg/m  General:  Well developed, well nourished, no acute distress Skin:  Warm and dry Neck:  Midline trachea, normal thyroid, good ROM, no lymphadenopathy Lungs; Clear to auscultation bilaterally Breast:  No dominant palpable mass, retraction, or nipple discharge Cardiovascular: Regular rate and rhythm Abdomen:  Soft, non tender, no hepatosplenomegaly Pelvic:  External genitalia is normal in appearance, no lesions.  The vagina is normal in appearance. Urethra has no lesions or masses. The cervix is bulbous.  Uterus is felt to be normal size, shape, and contour.  No adnexal masses or tenderness noted.Bladder is non tender, no masses felt. Rectal: Good sphincter tone, no polyps, + hemorrhoids felt.  Hemoccult negative. Extremities/musculoskeletal:  No swelling or varicosities noted, no clubbing or cyanosis Psych:  No mood changes, alert and cooperative,seems happy Fal risk is low. PHQ 2  score 0. Examination chaperoned by Marchelle Folks Rash LPN.   Impression: 1. Encounter for gynecological examination with Papanicolaou smear of cervix   2. Hormone replacement therapy (HRT)   3. Screening for colorectal cancer   4. Weight loss counseling, encounter for   5. Body mass index 27.0-27.9, adult   6. Hemorrhoids, unspecified hemorrhoid type       Plan: Order given to check labs at [facility] and lipids,A1c and vitamin D Physical in 1 year Pap in 3 if normal Mammogram soon and every 2 years Colonoscopy per GI  Meds ordered this encounter  Medications  . phentermine (ADIPEX-P) 37.5 MG tablet    Sig: Take 1 tablet (37.5 mg total) by mouth daily before breakfast.    Dispense:  30 tablet    Refill:  0    Order Specific Question:   Supervising Provider    Answer:   Lazaro Arms [[id number]]  Start working on diet again Call when needs refills on HRT is good for now.

## 2018-05-17 ENCOUNTER — Encounter: Payer: Self-pay | Admitting: Adult Health

## 2018-05-17 ENCOUNTER — Telehealth: Payer: Self-pay | Admitting: Adult Health

## 2018-05-17 DIAGNOSIS — E559 Vitamin D deficiency, unspecified: Secondary | ICD-10-CM

## 2018-05-17 HISTORY — DX: Vitamin D deficiency, unspecified: E55.9

## 2018-05-17 MED ORDER — CHOLECALCIFEROL 125 MCG (5000 UT) PO CAPS: 5000.0000 [IU] | ORAL_CAPSULE | Freq: Every day | ORAL | Status: DC

## 2018-05-17 NOTE — Telephone Encounter (Signed)
Pt had labs at work and faxed me a copy, all is good except vtamin D low at 19.5, take vitamin D 5000 IU daily

## 2018-05-17 NOTE — Telephone Encounter (Signed)
Please call pt about her labs

## 2018-05-18 LAB — CYTOLOGY - PAP
Diagnosis: NEGATIVE
HPV (WINDOPATH): NOT DETECTED

## 2018-06-26 ENCOUNTER — Other Ambulatory Visit: Payer: Self-pay | Admitting: Adult Health

## 2018-10-12 ENCOUNTER — Other Ambulatory Visit: Payer: Self-pay

## 2018-10-12 ENCOUNTER — Encounter: Payer: Self-pay | Admitting: Podiatry

## 2018-10-12 ENCOUNTER — Ambulatory Visit (INDEPENDENT_AMBULATORY_CARE_PROVIDER_SITE_OTHER): Payer: Commercial Managed Care - PPO | Admitting: Podiatry

## 2018-10-12 VITALS — BP 124/73 | HR 74 | Temp 98.2°F | Resp 16

## 2018-10-12 DIAGNOSIS — L603 Nail dystrophy: Secondary | ICD-10-CM | POA: Diagnosis not present

## 2018-10-13 NOTE — Progress Notes (Signed)
  Subjective:  Patient ID: Samantha [name], female    DOB: [birth year],  MRN: [id number] HPI Chief Complaint  Patient presents with  . Nail Problem    Hallux nails bilateral (L>R) - thick, discolored nail x years, been like this now x 6 months, has been taking lamisil since May, been on lamisil 2 other times before  . New Patient (Initial Visit)    53 y.o. female presents with the above complaint.   ROS: Denies fever chills nausea vomiting muscle aches pains calf pain back pain chest pain shortness of breath.  Past Medical History:  Diagnosis Date  . Anxiety   . Anxiety 04/11/2013  . Contraception management 04/11/2013  . Vitamin D deficiency 05/17/2018   19.5 on 05/14/2018,  Take 5000 IU vitamin D    Past Surgical History:  Procedure Laterality Date  . COLONOSCOPY N/A 08/14/2016   Procedure: COLONOSCOPY;  Surgeon: Rogene Houston, MD;  Location: [facility];  Service: Endoscopy;  Laterality: N/A;  730  . veins      Current Outpatient Medications:  .  estradiol (ESTRACE) 1 MG tablet, Take 1 tablet by mouth once daily, Disp: 90 tablet, Rfl: 4 .  Norgestimate-Ethinyl Estradiol Triphasic 0.18/0.215/0.25 MG-25 MCG tab, Take by mouth., Disp: , Rfl:  .  progesterone (PROMETRIUM) 200 MG capsule, Take 1 capsule by mouth once daily, Disp: 90 capsule, Rfl: 4 .  terbinafine (LAMISIL) 250 MG tablet, TAKE 1 TABLET BY MOUTH ONCE DAILY FOR 12 WEEKS, Disp: , Rfl:   No Known Allergies Review of Systems Objective:   Vitals:   10/12/18 1607  BP: 124/73  Pulse: 74  Resp: 16  Temp: 98.2 F (36.8 C)    General: Well developed, nourished, in no acute distress, alert and oriented x3   Dermatological: Skin is warm, dry and supple bilateral. Nails x 10 are well maintained; remaining integument appears unremarkable at this time. There are no open sores, no preulcerative lesions, no rash or signs of infection present.  Toenail hallux left appears to have distal Onikul lysis with no subungual  debris.  Hallux right demonstrates a darkening of the nail along the lateral border distally and thickening with some subungual debris.  No tinea pedis.  Vascular: Dorsalis Pedis artery and Posterior Tibial artery pedal pulses are 2/4 bilateral with immedate capillary fill time. Pedal hair growth present. No varicosities and no lower extremity edema present bilateral.   Neruologic: Grossly intact via light touch bilateral. Vibratory intact via tuning fork bilateral. Protective threshold with Semmes Wienstein monofilament intact to all pedal sites bilateral. Patellar and Achilles deep tendon reflexes 2+ bilateral. No Babinski or clonus noted bilateral.   Musculoskeletal: No gross boney pedal deformities bilateral. No pain, crepitus, or limitation noted with foot and ankle range of motion bilateral. Muscular strength 5/5 in all groups tested bilateral.  Gait: Unassisted, Nonantalgic.    Radiographs:  None taken  Assessment & Plan:   Assessment: Nail dystrophy hallux left possible onychomycosis right  Plan: Took samples of the nails both feet today to be sent for pathologic evaluation separately on to follow-up with her once that comes in in the meantime she will continue her Lamisil that her primary provider had put her on.     Garren Greenman T. [name], [place]

## 2018-11-03 ENCOUNTER — Telehealth: Payer: Self-pay | Admitting: *Deleted

## 2018-11-03 NOTE — Telephone Encounter (Signed)
Pt asked if she should continue terbinafine. I told pt there was no fungus and could stop the terbinafine and only other treatment would be removal of the nail. Pt requested appt be 11/16/2018 cancelled.

## 2018-11-03 NOTE — Telephone Encounter (Signed)
-----   Message from Samantha [name], [place] sent at 11/03/2018  7:29 AM EDT ----- This is negative for fungus.  Is not onychodystrophy patient does not have to come in since there is no treatment plan.  Only other option would be nail removal.

## 2018-11-03 NOTE — Telephone Encounter (Signed)
Left message informing pt Dr. Milinda Pointer had reviewed her results as negative and no treatment was needed.

## 2018-11-16 ENCOUNTER — Ambulatory Visit: Payer: Commercial Managed Care - PPO | Admitting: Podiatry

## 2019-06-10 ENCOUNTER — Telehealth: Payer: Self-pay | Admitting: *Deleted

## 2019-06-10 MED ORDER — ESTRADIOL 1 MG PO TABS: 1.0000 mg | ORAL_TABLET | Freq: Every day | ORAL | 4 refills | Status: DC

## 2019-06-10 MED ORDER — PROGESTERONE MICRONIZED 200 MG PO CAPS: 200.0000 mg | ORAL_CAPSULE | Freq: Every day | ORAL | 4 refills | Status: DC

## 2019-06-10 MED ORDER — PHENTERMINE HCL 37.5 MG PO TABS: 37.5000 mg | ORAL_TABLET | Freq: Every day | ORAL | 0 refills | Status: DC

## 2019-06-10 NOTE — Telephone Encounter (Signed)
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 She is scheduled for a visit on 2/25 but her work doesn't want her to take it off. She wanted to see if Jenn could refill her meds and also send her in phentermine. Current weight 170

## 2019-06-10 NOTE — Telephone Encounter (Signed)
Weight 170  BP 112/72 wants adipex and refill HRT  Hopes to be here next week but mya have to work at [employer]

## 2019-06-16 ENCOUNTER — Other Ambulatory Visit: Payer: Commercial Managed Care - PPO | Admitting: Adult Health

## 2019-07-18 ENCOUNTER — Telehealth: Payer: Self-pay | Admitting: Adult Health

## 2019-07-18 MED ORDER — PHENTERMINE HCL 37.5 MG PO TABS: 37.5000 mg | ORAL_TABLET | Freq: Every day | ORAL | 0 refills | Status: DC

## 2019-07-18 NOTE — Telephone Encounter (Signed)
Pt requesting a refill of her phentermine States her BP is good. Please send to [facility] [place].

## 2019-07-18 NOTE — Telephone Encounter (Signed)
Will refill adipex,

## 2019-07-18 NOTE — Addendum Note (Signed)
Addended by: Cyril Mourning A on: 07/18/2019 04:49 PM   Modules accepted: Orders

## 2019-08-02 ENCOUNTER — Encounter: Payer: Self-pay | Admitting: Adult Health

## 2019-08-02 ENCOUNTER — Other Ambulatory Visit: Payer: Self-pay

## 2019-08-02 ENCOUNTER — Ambulatory Visit (INDEPENDENT_AMBULATORY_CARE_PROVIDER_SITE_OTHER): Payer: Commercial Managed Care - PPO | Admitting: Adult Health

## 2019-08-02 VITALS — BP 130/77 | HR 84 | Ht 65.0 in | Wt 160.0 lb

## 2019-08-02 DIAGNOSIS — K649 Unspecified hemorrhoids: Secondary | ICD-10-CM

## 2019-08-02 DIAGNOSIS — Z1212 Encounter for screening for malignant neoplasm of rectum: Secondary | ICD-10-CM

## 2019-08-02 DIAGNOSIS — Z1211 Encounter for screening for malignant neoplasm of colon: Secondary | ICD-10-CM

## 2019-08-02 DIAGNOSIS — Z01419 Encounter for gynecological examination (general) (routine) without abnormal findings: Secondary | ICD-10-CM | POA: Diagnosis not present

## 2019-08-02 DIAGNOSIS — Z713 Dietary counseling and surveillance: Secondary | ICD-10-CM

## 2019-08-02 DIAGNOSIS — Z79890 Hormone replacement therapy (postmenopausal): Secondary | ICD-10-CM

## 2019-08-02 LAB — HEMOCCULT GUIAC POC 1CARD (OFFICE): Fecal Occult Blood, POC: NEGATIVE

## 2019-08-02 MED ORDER — ESTRADIOL 1 MG PO TABS: 1.0000 mg | ORAL_TABLET | Freq: Every day | ORAL | 4 refills | Status: DC

## 2019-08-02 MED ORDER — PROGESTERONE 200 MG PO CAPS: ORAL_CAPSULE | ORAL | 4 refills | Status: DC

## 2019-08-02 MED ORDER — PHENTERMINE HCL 37.5 MG PO TABS: 37.5000 mg | ORAL_TABLET | Freq: Every day | ORAL | 0 refills | Status: DC

## 2019-08-02 NOTE — Progress Notes (Signed)
Patient ID: Samantha [name], female   DOB: 04/04/1966, 54 y.o.   MRN: [id number] History of Present Illness: Samantha [name] is a 54 year old female,married, PM in for a well woman gyn exam. She had a normal pap with negative HPV 05/17/18. PCP is [facility].  Current Medications, Allergies, Past Medical History, Past Surgical History, Family History and Social History were reviewed in [employer] record.     Review of Systems: Patient denies any headaches, hearing loss, fatigue, blurred vision, shortness of breath, chest pain, abdominal pain, problems with bowel movements, urination, or intercourse. No joint pain or mood swings. Pt is exercising daily and would like to continue adipex 1 more month.  She is happy with HRT.     Physical Exam:BP 130/77 (BP Location: Left Arm, Patient Position: Sitting, Cuff Size: Normal)   Pulse 84   Ht 5\' 5"  (1.651 m)   Wt 160 lb (72.6 kg)   LMP 03/20/2016 (Approximate)   BMI 26.63 kg/m  General:  Well developed, well nourished, no acute distress Skin:  Warm and dry Neck:  Midline trachea, normal thyroid, good ROM, no lymphadenopathy Lungs; Clear to auscultation bilaterally Breast:  No dominant palpable mass, retraction, or nipple discharge Cardiovascular: Regular rate and rhythm Abdomen:  Soft, non tender, no hepatosplenomegaly Pelvic:  External genitalia is normal in appearance, no lesions.  The vagina is normal in appearance. Urethra has no lesions or masses. The cervix is bulbous and everted at os.  Uterus is felt to be normal size, shape, and contour.  No adnexal masses or tenderness noted.Bladder is non tender, no masses felt. Rectal: Good sphincter tone, no polyps, + hemorrhoids felt.  Hemoccult negative. Extremities/musculoskeletal:  No swelling or varicosities noted, no clubbing or cyanosis Psych:  No mood changes, alert and cooperative,seems happy AA is o Fall risk is low PHQ 9 score is 1. Examination chaperoned by 03/22/2016 LPN  Impression and Plan: 1. Encounter for well woman exam with routine gynecological exam Physical in 1 year Pap in 2023 Get mammogram  Order given for labs at work, Check CBC,CMP,TSH and lipids, A1c and vitamin D  2. Screening for colorectal cancer Colonoscopy per GI  3. Hormone replacement therapy (HRT) Refilled estrace and Prometrium Meds ordered this encounter  Medications  . progesterone (PROMETRIUM) 200 MG capsule    Sig: Take 1 daily at HS    Dispense:  90 capsule    Refill:  4    Order Specific Question:   Supervising Provider    Answer:   2024, LUTHER H [[id number]]  . estradiol (ESTRACE) 1 MG tablet    Sig: Take 1 tablet (1 mg total) by mouth daily.    Dispense:  90 tablet    Refill:  4    Order Specific Question:   Supervising Provider    Answer:   Despina Hidden, LUTHER H [[id number]]  . phentermine (ADIPEX-P) 37.5 MG tablet    Sig: Take 1 tablet (37.5 mg total) by mouth daily before breakfast.    Dispense:  30 tablet    Refill:  0    Order Specific Question:   Supervising Provider    Answer:   Despina Hidden, LUTHER H [[id number]]    4. Hemorrhoids, unspecified hemorrhoid type  5. Weight loss counseling, encounter for Will rx adipex.last refill

## 2019-09-18 ENCOUNTER — Other Ambulatory Visit: Payer: Self-pay | Admitting: Adult Health

## 2020-04-21 HISTORY — PX: BREAST BIOPSY: SHX20

## 2020-08-06 ENCOUNTER — Other Ambulatory Visit (HOSPITAL_COMMUNITY): Payer: Self-pay | Admitting: Adult Health

## 2020-08-06 DIAGNOSIS — Z1231 Encounter for screening mammogram for malignant neoplasm of breast: Secondary | ICD-10-CM

## 2020-08-22 ENCOUNTER — Telehealth: Payer: Self-pay | Admitting: *Deleted

## 2020-08-22 ENCOUNTER — Ambulatory Visit (HOSPITAL_COMMUNITY)
Admission: RE | Admit: 2020-08-22 | Discharge: 2020-08-22 | Disposition: A | Discharge status: Home / Self Care | Payer: Commercial Managed Care - PPO | Source: Ambulatory Visit | Attending: Adult Health | Admitting: Adult Health

## 2020-08-22 ENCOUNTER — Other Ambulatory Visit (HOSPITAL_COMMUNITY): Payer: Self-pay | Admitting: Adult Health

## 2020-08-22 DIAGNOSIS — R928 Other abnormal and inconclusive findings on diagnostic imaging of breast: Secondary | ICD-10-CM

## 2020-08-22 DIAGNOSIS — Z1231 Encounter for screening mammogram for malignant neoplasm of breast: Secondary | ICD-10-CM | POA: Diagnosis present

## 2020-08-22 NOTE — Telephone Encounter (Signed)
Pt had an abnormal mammogram this am. Pt needs a diagnostic and possible [place]. Please schedule ASAP. Pt is going on vacation 09/04/20. Pt is very nervous. Thanks!! JSY

## 2020-08-22 NOTE — Telephone Encounter (Signed)
Left message I called 

## 2020-08-28 ENCOUNTER — Ambulatory Visit (HOSPITAL_COMMUNITY)
Admission: RE | Admit: 2020-08-28 | Discharge: 2020-08-28 | Disposition: A | Discharge status: Home / Self Care | Payer: Commercial Managed Care - PPO | Source: Ambulatory Visit | Attending: Adult Health | Admitting: Adult Health

## 2020-08-28 ENCOUNTER — Other Ambulatory Visit (HOSPITAL_COMMUNITY): Payer: Self-pay | Admitting: Adult Health

## 2020-08-28 ENCOUNTER — Other Ambulatory Visit: Payer: Self-pay

## 2020-08-28 DIAGNOSIS — R928 Other abnormal and inconclusive findings on diagnostic imaging of breast: Secondary | ICD-10-CM

## 2020-08-31 ENCOUNTER — Other Ambulatory Visit: Payer: Commercial Managed Care - PPO | Admitting: Adult Health

## 2020-09-03 ENCOUNTER — Other Ambulatory Visit: Payer: Self-pay

## 2020-09-03 ENCOUNTER — Encounter: Payer: Self-pay | Admitting: Adult Health

## 2020-09-03 ENCOUNTER — Ambulatory Visit (INDEPENDENT_AMBULATORY_CARE_PROVIDER_SITE_OTHER): Payer: Commercial Managed Care - PPO | Admitting: Adult Health

## 2020-09-03 VITALS — BP 145/85 | HR 76 | Ht 65.0 in | Wt 161.0 lb

## 2020-09-03 DIAGNOSIS — K649 Unspecified hemorrhoids: Secondary | ICD-10-CM

## 2020-09-03 DIAGNOSIS — Z1211 Encounter for screening for malignant neoplasm of colon: Secondary | ICD-10-CM | POA: Diagnosis not present

## 2020-09-03 DIAGNOSIS — Z01419 Encounter for gynecological examination (general) (routine) without abnormal findings: Secondary | ICD-10-CM

## 2020-09-03 DIAGNOSIS — Z79890 Hormone replacement therapy (postmenopausal): Secondary | ICD-10-CM

## 2020-09-03 DIAGNOSIS — R928 Other abnormal and inconclusive findings on diagnostic imaging of breast: Secondary | ICD-10-CM | POA: Insufficient documentation

## 2020-09-03 LAB — HEMOCCULT GUIAC POC 1CARD (OFFICE): Fecal Occult Blood, POC: NEGATIVE

## 2020-09-03 NOTE — Progress Notes (Signed)
Patient ID: Samantha [name], female   DOB: 12/25/1965, 55 y.o.   MRN: [id number] History of Present Illness: Samantha [name] is a 55 year old female, married, [id number], in for a well woman gyn exam.  She is still working at the health dept. She is having breast biopsy next week. She is going to visit her parents in [place] for a week. Lab Results  Component Value Date   DIAGPAP  05/14/2018    NEGATIVE FOR INTRAEPITHELIAL LESIONS OR MALIGNANCY.   HPV NOT DETECTED 05/14/2018  PCP is Roma Kayser PA   Current Medications, Allergies, Past Medical History, Past Surgical History, Family History and Social History were reviewed in [employer] [facility] record.     Review of Systems: Patient denies any headaches, hearing loss, fatigue, blurred vision, shortness of breath, chest pain, abdominal pain, problems with bowel movements, urination, or intercourse. No joint pain or mood swings.Denies any vaginal bleeding    Physical Exam:BP (!) 145/85 (BP Location: Left Arm, Patient Position: Sitting, Cuff Size: Normal)   Pulse 76   Ht 5\' 5"  (1.651 m)   Wt 161 lb (73 kg)   LMP 03/20/2016 (Approximate)   BMI 26.79 kg/m  General:  Well developed, well nourished, no acute distress Skin:  Warm and dry Neck:  Midline trachea, normal thyroid, good ROM, no lymphadenopathy Lungs; Clear to auscultation bilaterally Breast:  No dominant palpable mass, retraction, or nipple discharge Cardiovascular: Regular rate and rhythm Abdomen:  Soft, non tender, no hepatosplenomegaly Pelvic:  External genitalia is normal in appearance, no lesions.  The vagina is normal in appearance. Urethra has no lesions or masses. The cervix is bulbous.  Uterus is felt to be normal size, shape, and contour.  No adnexal masses or tenderness noted.Bladder is non tender, no masses felt. Rectal: Good sphincter tone, no polyps, + hemorrhoids felt.  Hemoccult negative. Extremities/musculoskeletal:  No swelling or varicosities noted, no  clubbing or cyanosis Psych:  No mood changes, alert and cooperative,seems happy AA is 0 Fall risk is low Depression screen [facility] 2/9 09/03/2020 08/02/2019 05/14/2018  Decreased Interest 3 0 0  Down, Depressed, Hopeless 3 0 0  PHQ - 2 Score 6 0 0  Altered sleeping 3 1 -  Tired, decreased energy 0 0 -  Change in appetite 3 0 -  Feeling bad or failure about yourself  0 0 -  Trouble concentrating 3 0 -  Moving slowly or fidgety/restless 0 0 -  Suicidal thoughts 0 0 -  PHQ-9 Score 15 1 -  Difficult doing work/chores - Not difficult at all -   GAD 7 : Generalized Anxiety Score 09/03/2020 08/02/2019 07/27/2015  Nervous, Anxious, on Edge 3 3 0  Control/stop worrying 3 3 0  Worry too much - different things 3 3 0  Trouble relaxing 3 3 0  Restless 3 0 0  Easily annoyed or irritable 3 0 0  Afraid - awful might happen 3 0 0  Total GAD 7 Score 21 12 0  Anxiety Difficulty Not difficult at all Not difficult at all Not difficult at all   She says she is just worried over biopsy next week, she declines any meds. Examination chaperoned by 09/26/2015 LPN  Impression and Plan: 1. Encounter for well woman exam with routine gynecological exam Pap and physical in 1 year Labs at work Colonoscopy per GI  2. Encounter for screening fecal occult blood testing   3. Hormone replacement therapy (HRT) Continue estrace and Prometrium for now, has refills  4. Hemorrhoids, unspecified hemorrhoid type  5. Abnormal mammogram of right breast Has biopsy next week

## 2020-09-04 ENCOUNTER — Ambulatory Visit (HOSPITAL_COMMUNITY): Payer: Commercial Managed Care - PPO

## 2020-09-11 ENCOUNTER — Other Ambulatory Visit (HOSPITAL_COMMUNITY): Payer: Self-pay | Admitting: Physician Assistant

## 2020-09-11 ENCOUNTER — Ambulatory Visit (HOSPITAL_COMMUNITY)
Admission: RE | Admit: 2020-09-11 | Discharge: 2020-09-11 | Disposition: A | Discharge status: Home / Self Care | Payer: Commercial Managed Care - PPO | Source: Ambulatory Visit | Attending: Physician Assistant | Admitting: Physician Assistant

## 2020-09-11 ENCOUNTER — Ambulatory Visit (HOSPITAL_COMMUNITY)
Admission: RE | Admit: 2020-09-11 | Discharge: 2020-09-11 | Disposition: A | Discharge status: Home / Self Care | Payer: Commercial Managed Care - PPO | Source: Ambulatory Visit | Attending: Adult Health | Admitting: Adult Health

## 2020-09-11 ENCOUNTER — Encounter (HOSPITAL_COMMUNITY): Payer: Self-pay

## 2020-09-11 DIAGNOSIS — N6315 Unspecified lump in the right breast, overlapping quadrants: Secondary | ICD-10-CM | POA: Insufficient documentation

## 2020-09-11 DIAGNOSIS — R928 Other abnormal and inconclusive findings on diagnostic imaging of breast: Secondary | ICD-10-CM

## 2020-09-11 MED ORDER — LIDOCAINE HCL (PF) 2 % IJ SOLN
INTRAMUSCULAR | Status: AC
  Administered 2020-09-11: 10 mL
  Filled 2020-09-11: qty 10

## 2020-09-11 MED ORDER — LIDOCAINE-EPINEPHRINE (PF) 2 %-1:200000 IJ SOLN
INTRAMUSCULAR | Status: AC
  Administered 2020-09-11: 10 mL
  Filled 2020-09-11: qty 10

## 2020-09-11 NOTE — Discharge Instructions (Signed)
Breast Biopsy, Care After °These instructions give you information about caring for yourself after your procedure. Your doctor may also give you more specific instructions. Call your doctor if you have any problems or questions after your procedure. °What can I expect after the procedure? °After your procedure, it is common to have: °· Bruising on your breast. °· Numbness, tingling, or pain near your biopsy site. °Follow these instructions at home: °Medicines °· Take over-the-counter and prescription medicines only as told by your doctor. °· Do not drive for 24 hours if you were given a medicine to help you relax (sedative) during your procedure. °· Do not drink alcohol while taking pain medicine. °· Do not drive or use heavy machinery while taking prescription pain medicine. °Biopsy site care °· Follow instructions from your doctor about how to take care of your cut from surgery (incision) or your puncture area. Make sure you: °? Wash your hands with soap and water before you change your bandage (dressing). If you cannot use soap and water, use hand sanitizer. °? Change your bandage as told by your doctor. °? Leave stitches (sutures), skin glue, or skin tape (adhesive strips) in place. They may need to stay in place for 2 weeks or longer. If tape strips get loose and curl up, you may trim the loose edges. Do not remove tape strips completely unless your doctor says it is okay. °· If you have stitches, keep them dry when you take a bath or a shower. °· Check your cut or puncture area every day for signs of infection. Check for: °? Redness, swelling, or pain. °? Fluid or blood. °? Warmth. °? Pus or a bad smell. °· Protect the biopsy area. Do not let the area get bumped.  °  °  °Activity °· If you had a cut during your procedure, avoid activities that could pull your cut open. These include: °? Stretching. °? Reaching over your head. °? Exercise. °? Sports. °? Lifting anything that weighs more than 3 lb (1.4  kg). °· Return to your normal activities as told by your doctor. Ask your doctor what activities are safe for you. °Managing pain, stiffness, and swelling °If told, put ice on the biopsy site to relieve swelling: °· Put ice in a plastic bag. °· Place a towel between your skin and the bag. °· Leave the ice on for 20 minutes, 2-3 times a day. °General instructions °· Continue your normal diet. °· Wear a good support bra for as long as told by your doctor. °· Get checked for extra fluid around your lymph nodes (lymphedema) as often as told by your doctor. °· Keep all follow-up visits as told by your doctor. This is important. °Contact a doctor if: °· You notice any of the following at the biopsy site: °? More redness, swelling, or pain. °? More fluid or blood coming from the site. °? The site feels warm to the touch. °? Pus or a bad smell coming from the site. °? The site breaks open after the stitches or skin tape strips have been removed. °· You have a rash. °· You have a fever. °Get help right away if: °· You have more bleeding from the biopsy site. Get help right away if bleeding is more than a small spot. °· You have trouble breathing. °· You have red streaks around the biopsy site. °Summary °· After your procedure, it is common to have bruising, numbness, tingling, or pain near the biopsy site. °· Do not   drive or use heavy machinery while taking prescription pain medicine. °· Wear a good support bra for as long as told by your doctor. °· If you had a cut during your procedure, avoid activities that may pull the cut open. Ask your doctor what activities are safe for you. °This information is not intended to replace advice given to you by your health care provider. Make sure you discuss any questions you have with your health care provider. °Document Revised: 12/19/2019 Document Reviewed: 09/24/2017 °Elsevier Patient Education © 2021 [employer] ° °

## 2020-09-11 NOTE — Sedation Documentation (Signed)
PT tolerated right breast biopsy well today with NAD noted. PT verbalized understanding of discharge instructions. PT ambulated back to the mammogram area this time and was given paper copy of discharge instructions and ice packs.

## 2020-09-13 LAB — SURGICAL PATHOLOGY

## 2020-09-26 ENCOUNTER — Other Ambulatory Visit: Payer: Self-pay | Admitting: Adult Health

## 2020-12-17 ENCOUNTER — Other Ambulatory Visit: Payer: Self-pay | Admitting: Sports Medicine

## 2020-12-17 ENCOUNTER — Other Ambulatory Visit (HOSPITAL_COMMUNITY): Payer: Self-pay | Admitting: Sports Medicine

## 2020-12-17 DIAGNOSIS — M25551 Pain in right hip: Secondary | ICD-10-CM

## 2020-12-27 ENCOUNTER — Encounter (HOSPITAL_COMMUNITY): Payer: Self-pay

## 2020-12-27 ENCOUNTER — Other Ambulatory Visit: Payer: Self-pay

## 2020-12-27 ENCOUNTER — Ambulatory Visit (HOSPITAL_COMMUNITY)
Admission: RE | Admit: 2020-12-27 | Discharge: 2020-12-27 | Disposition: A | Discharge status: Home / Self Care | Payer: Commercial Managed Care - PPO | Source: Ambulatory Visit | Attending: Sports Medicine | Admitting: Sports Medicine

## 2020-12-27 DIAGNOSIS — M25551 Pain in right hip: Secondary | ICD-10-CM | POA: Insufficient documentation

## 2020-12-27 MED ORDER — LIDOCAINE HCL (PF) 1 % IJ SOLN
INTRAMUSCULAR | Status: AC
  Administered 2020-12-27: 5 mL
  Filled 2020-12-27: qty 5

## 2020-12-27 MED ORDER — BUPIVACAINE HCL (PF) 0.5 % IJ SOLN
INTRAMUSCULAR | Status: AC
  Administered 2020-12-27: 4 mL
  Filled 2020-12-27: qty 30

## 2020-12-27 MED ORDER — POVIDONE-IODINE 10 % EX SOLN
CUTANEOUS | Status: AC
  Administered 2020-12-27: 1
  Filled 2020-12-27: qty 15

## 2020-12-27 MED ORDER — IOHEXOL 300 MG/ML  SOLN
30.0000 mL | Freq: Once | INTRAMUSCULAR | Status: AC | PRN
  Administered 2020-12-27: 2 mL via INTRA_ARTICULAR

## 2020-12-27 MED ORDER — TRIAMCINOLONE ACETONIDE 40 MG/ML IJ SUSP
INTRAMUSCULAR | Status: AC
  Administered 2020-12-27: 40 mg
  Filled 2020-12-27: qty 1

## 2020-12-27 NOTE — Procedures (Signed)
Preprocedure Dx: Right hip pain Postprocedure Dx: Right hip psin Procedure  Fluoroscopically guided therapeutic right hip joint injection Radiologist:  Tyron Russell Anesthesia:  5 ml of 1% lidocaine Injectate:  40mg  Kenalog, 4 ml Marcaine 0.5% Fluoro time:  0 minutes 24 seconds EBL:   None Complications: None

## 2021-04-02 ENCOUNTER — Other Ambulatory Visit: Payer: Self-pay | Admitting: Sports Medicine

## 2021-04-02 DIAGNOSIS — S73191A Other sprain of right hip, initial encounter: Secondary | ICD-10-CM

## 2021-04-29 ENCOUNTER — Ambulatory Visit
Admission: RE | Admit: 2021-04-29 | Discharge: 2021-04-29 | Disposition: A | Discharge status: Home / Self Care | Payer: Commercial Managed Care - PPO | Source: Ambulatory Visit | Attending: Sports Medicine | Admitting: Sports Medicine

## 2021-04-29 DIAGNOSIS — S73191A Other sprain of right hip, initial encounter: Secondary | ICD-10-CM

## 2021-04-29 MED ORDER — IOPAMIDOL (ISOVUE-M 200) INJECTION 41%
15.0000 mL | Freq: Once | INTRAMUSCULAR | Status: AC
  Administered 2021-04-29: 15 mL via INTRA_ARTICULAR

## 2021-07-01 ENCOUNTER — Other Ambulatory Visit (HOSPITAL_COMMUNITY): Payer: Self-pay | Admitting: Adult Health

## 2021-07-01 DIAGNOSIS — Z1231 Encounter for screening mammogram for malignant neoplasm of breast: Secondary | ICD-10-CM

## 2021-07-23 ENCOUNTER — Ambulatory Visit: Payer: Commercial Managed Care - PPO | Admitting: Dermatology

## 2021-09-02 ENCOUNTER — Other Ambulatory Visit: Payer: Commercial Managed Care - PPO | Admitting: Adult Health

## 2021-09-02 ENCOUNTER — Ambulatory Visit (HOSPITAL_COMMUNITY): Payer: Commercial Managed Care - PPO

## 2021-09-04 ENCOUNTER — Ambulatory Visit (INDEPENDENT_AMBULATORY_CARE_PROVIDER_SITE_OTHER): Payer: Commercial Managed Care - PPO | Admitting: Adult Health

## 2021-09-04 ENCOUNTER — Ambulatory Visit (HOSPITAL_COMMUNITY)
Admission: RE | Admit: 2021-09-04 | Discharge: 2021-09-04 | Disposition: A | Discharge status: Home / Self Care | Payer: Commercial Managed Care - PPO | Source: Ambulatory Visit | Attending: Adult Health | Admitting: Adult Health

## 2021-09-04 ENCOUNTER — Other Ambulatory Visit (HOSPITAL_COMMUNITY): Payer: Self-pay | Admitting: Adult Health

## 2021-09-04 ENCOUNTER — Other Ambulatory Visit (HOSPITAL_COMMUNITY)
Admission: RE | Admit: 2021-09-04 | Discharge: 2021-09-04 | Disposition: A | Discharge status: Home / Self Care | Payer: Commercial Managed Care - PPO | Source: Ambulatory Visit | Attending: Adult Health | Admitting: Adult Health

## 2021-09-04 ENCOUNTER — Encounter: Payer: Self-pay | Admitting: Adult Health

## 2021-09-04 VITALS — BP 117/67 | HR 73 | Ht 65.0 in | Wt 164.0 lb

## 2021-09-04 DIAGNOSIS — Z124 Encounter for screening for malignant neoplasm of cervix: Secondary | ICD-10-CM

## 2021-09-04 DIAGNOSIS — Z1211 Encounter for screening for malignant neoplasm of colon: Secondary | ICD-10-CM | POA: Diagnosis not present

## 2021-09-04 DIAGNOSIS — K649 Unspecified hemorrhoids: Secondary | ICD-10-CM

## 2021-09-04 DIAGNOSIS — Z1231 Encounter for screening mammogram for malignant neoplasm of breast: Secondary | ICD-10-CM | POA: Diagnosis present

## 2021-09-04 DIAGNOSIS — Z01419 Encounter for gynecological examination (general) (routine) without abnormal findings: Secondary | ICD-10-CM | POA: Diagnosis not present

## 2021-09-04 DIAGNOSIS — Z79890 Hormone replacement therapy (postmenopausal): Secondary | ICD-10-CM

## 2021-09-04 DIAGNOSIS — R928 Other abnormal and inconclusive findings on diagnostic imaging of breast: Secondary | ICD-10-CM

## 2021-09-04 LAB — HEMOCCULT GUIAC POC 1CARD (OFFICE): Fecal Occult Blood, POC: NEGATIVE

## 2021-09-04 MED ORDER — PROGESTERONE 200 MG PO CAPS: 200.0000 mg | ORAL_CAPSULE | Freq: Every day | ORAL | 4 refills | Status: DC

## 2021-09-04 MED ORDER — ESTRADIOL 1 MG PO TABS: 1.0000 mg | ORAL_TABLET | Freq: Every day | ORAL | 4 refills | Status: DC

## 2021-09-04 NOTE — Progress Notes (Signed)
Patient ID: Samantha [name], female   DOB: 02-13-1966, 56 y.o.   MRN: [id number] ?History of Present Illness: ?Samantha [name] is a 56 year old Hispanic female, married, PM on HRT in for a well woman gyn exam and pap. She is still working at [employer] almost 32 years now. ?PCP is Samantha Rutherford NP  ? ? ?Current Medications, Allergies, Past Medical History, Past Surgical History, Family History and Social History were reviewed in [employer] record.   ? ? ?Review of Systems: ?Patient denies any headaches, hearing loss, fatigue, blurred vision, shortness of breath, chest pain, abdominal pain, problems with bowel movements, urination, or intercourse. No joint pain or mood swings.  ?Has some rectal bleeding at times, has hemorrhoids ? ? ?Physical Exam:BP 117/67 (BP Location: Left Arm, Patient Position: Sitting, Cuff Size: Normal)   Pulse 73   Ht 5\' 5"  (1.651 m)   Wt 164 lb (74.4 kg)   LMP 03/20/2016 (Approximate)   BMI 27.29 kg/m?   ?General:  Well developed, well nourished, no acute distress ?Skin:  Warm and dry ?Neck:  Midline trachea, normal thyroid, good ROM, no lymphadenopathy ?Lungs; Clear to auscultation bilaterally ?Breast:  No dominant palpable mass, retraction, or nipple discharge ?Cardiovascular: Regular rate and rhythm ?Abdomen:  Soft, non tender, no hepatosplenomegaly ?Pelvic:  External genitalia is normal in appearance, no lesions.  The vagina is normal in appearance. Urethra has no lesions or masses. The cervix is bulbous, pap with HR HPV genotyping performed .  Uterus is felt to be normal size, shape, and contour.  No adnexal masses or tenderness noted.Bladder is non tender, no masses felt. ?Rectal: Good sphincter tone, no polyps, + hemorrhoids felt.  Hemoccult negative. ?Extremities/musculoskeletal:  No swelling or varicosities noted, no clubbing or cyanosis ?Psych:  No mood changes, alert and cooperative,seems happy ?AA is 0 ?Fall risk is low ? ?  09/04/2021  ?  8:38 AM 09/03/2020  ?   8:36 AM 08/02/2019  ?  8:56 AM  ?Depression screen PHQ 2/9  ?Decreased Interest 0 3 0  ?Down, Depressed, Hopeless 0 3 0  ?PHQ - 2 Score 0 6 0  ?Altered sleeping 0 3 1  ?Tired, decreased energy 0 0 0  ?Change in appetite 0 3 0  ?Feeling bad or failure about yourself  0 0 0  ?Trouble concentrating 0 3 0  ?Moving slowly or fidgety/restless 0 0 0  ?Suicidal thoughts 0 0 0  ?PHQ-9 Score 0 15 1  ?Difficult doing work/chores   Not difficult at all  ?  ? ?  09/04/2021  ?  8:38 AM 09/03/2020  ?  8:37 AM 08/02/2019  ?  8:56 AM 07/27/2015  ? 10:59 AM  ?GAD 7 : Generalized Anxiety Score  ?Nervous, Anxious, on Edge 0 3 3 0  ?Control/stop worrying 0 3 3 0  ?Worry too much - different things 0 3 3 0  ?Trouble relaxing 0 3 3 0  ?Restless 0 3 0 0  ?Easily annoyed or irritable 0 3 0 0  ?Afraid - awful might happen 0 3 0 0  ?Total GAD 7 Score 0 21 12 0  ?Anxiety Difficulty  Not difficult at all Not difficult at all Not difficult at all  ? ?  ? [place] - 09/04/21 0842   ? ?  ? Pregnancy Intention Screening  ? Does the patient want to become pregnant in the next year? No   ? Does the patient's partner want to become pregnant in the next  year? No   ? Would the patient like to discuss contraceptive options today? No   ?  ? Contraception Wrap Up  ? Current Method No Method - Other Reason   postmenopausal  ? End Method No Method - Other Reason   ? Contraception Counseling Provided No   ? ?  ?  ? ?  ?  ?Examination chaperoned by Clint Bolder RN ? ?Impression and Plan: ? ?1. Routine cervical smear ?Pap sent ?Pap in 3 years if normal ? ?2. Encounter for gynecological examination with Papanicolaou smear of cervix ?Pap sent ?Physical in 1 year ?Labs with PCP ?Had mammogram this morning ?Colonoscopy at 56 ? ?3. Encounter for screening fecal occult blood testing ?Hemoccult negative  ? ?4. Hormone replacement therapy (HRT) ?Will continue estrace and Prometrium ?Meds ordered this encounter  ?Medications  ? progesterone (PROMETRIUM) 200 MG capsule  ?  Sig:  Take 1 capsule (200 mg total) by mouth daily. Take 1 capsule daily at bedtime  ?  Dispense:  90 capsule  ?  Refill:  4  ?  Order Specific Question:   Supervising Provider  ?  Answer:   Duane Lope H [[id number]]  ? estradiol (ESTRACE) 1 MG tablet  ?  Sig: Take 1 tablet (1 mg total) by mouth daily.  ?  Dispense:  90 tablet  ?  Refill:  4  ?  Order Specific Question:   Supervising Provider  ?  Answer:   Duane Lope H [[id number]]  ?  ? ?5. Hemorrhoids, unspecified hemorrhoid type ? ?  ?  ?

## 2021-09-06 ENCOUNTER — Other Ambulatory Visit: Payer: Self-pay | Admitting: *Deleted

## 2021-09-06 DIAGNOSIS — R928 Other abnormal and inconclusive findings on diagnostic imaging of breast: Secondary | ICD-10-CM

## 2021-09-06 LAB — CYTOLOGY - PAP
Comment: NEGATIVE
Diagnosis: NEGATIVE
High risk HPV: NEGATIVE

## 2021-09-10 ENCOUNTER — Ambulatory Visit (HOSPITAL_COMMUNITY)
Admission: RE | Admit: 2021-09-10 | Discharge: 2021-09-10 | Disposition: A | Discharge status: Home / Self Care | Payer: Commercial Managed Care - PPO | Source: Ambulatory Visit | Attending: Adult Health | Admitting: Adult Health

## 2021-09-10 DIAGNOSIS — R928 Other abnormal and inconclusive findings on diagnostic imaging of breast: Secondary | ICD-10-CM

## 2021-09-18 ENCOUNTER — Ambulatory Visit (HOSPITAL_COMMUNITY): Payer: Commercial Managed Care - PPO

## 2021-09-18 ENCOUNTER — Encounter (HOSPITAL_COMMUNITY): Payer: Commercial Managed Care - PPO

## 2021-10-08 ENCOUNTER — Ambulatory Visit: Payer: Commercial Managed Care - PPO | Admitting: Physician Assistant

## 2022-02-13 ENCOUNTER — Ambulatory Visit: Payer: Commercial Managed Care - PPO | Admitting: Physician Assistant

## 2022-07-03 ENCOUNTER — Other Ambulatory Visit (HOSPITAL_COMMUNITY): Payer: Self-pay | Admitting: Adult Health

## 2022-07-03 DIAGNOSIS — Z1231 Encounter for screening mammogram for malignant neoplasm of breast: Secondary | ICD-10-CM

## 2022-09-06 IMAGING — MG MM DIGITAL DIAGNOSTIC UNILAT*R* W/ TOMO W/ CAD
4 series · 4 of 12 positions shown · non-contrast
Comparison: Previous exam(s).

CLINICAL DATA: Screening recall for right breast mass.

EXAM:
DIGITAL DIAGNOSTIC UNILATERAL RIGHT MAMMOGRAM WITH TOMOSYNTHESIS AND
CAD; ULTRASOUND RIGHT BREAST LIMITED
TECHNIQUE: Right digital diagnostic mammography and breast tomosynthesis was
performed. The images were evaluated with computer-aided detection.;
Targeted ultrasound examination of the right breast was performed

[R CC synth-2D]
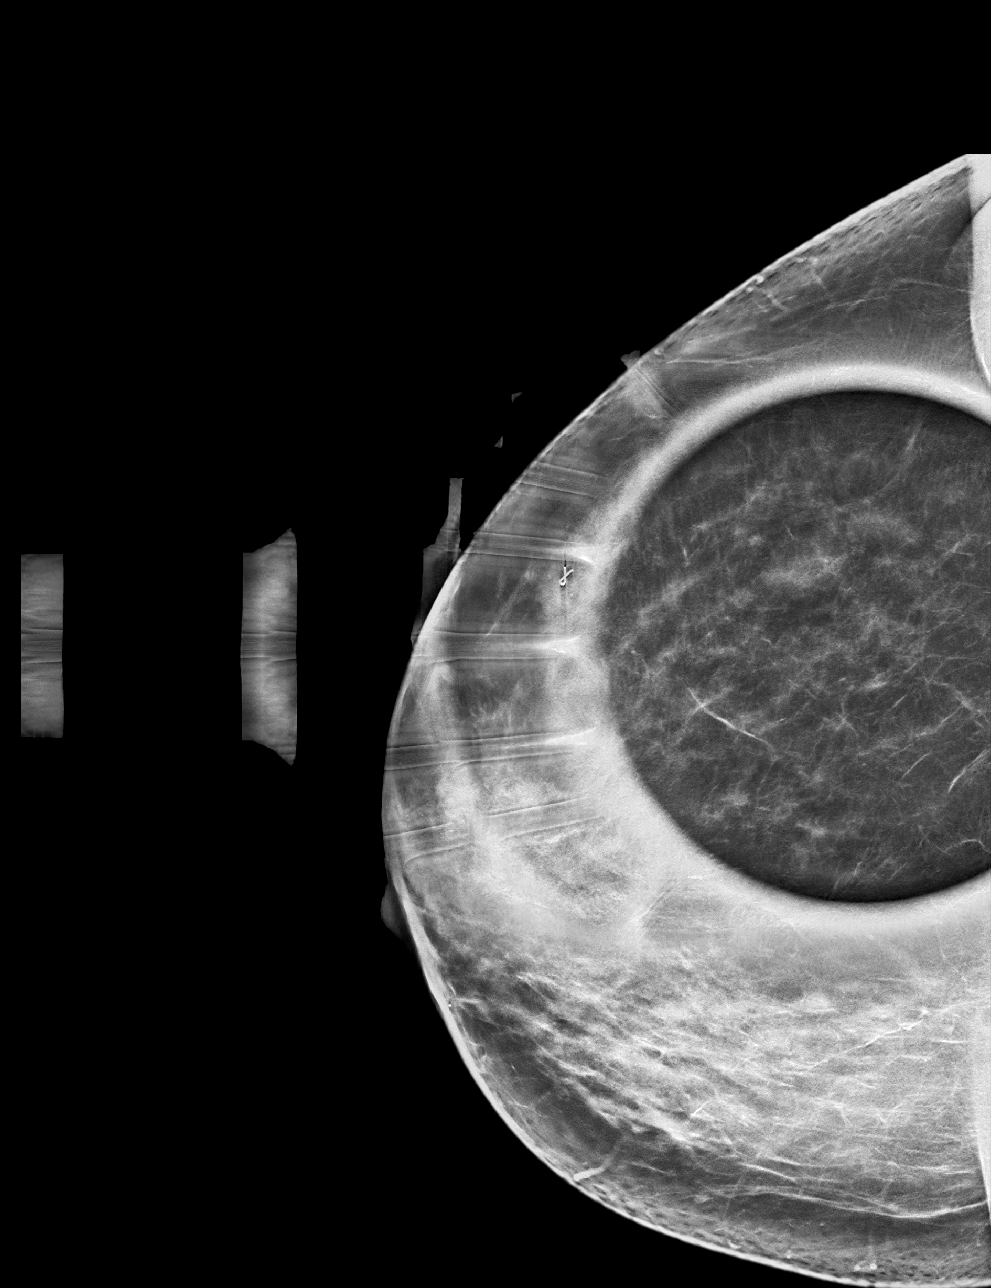

[R MLO synth-2D]
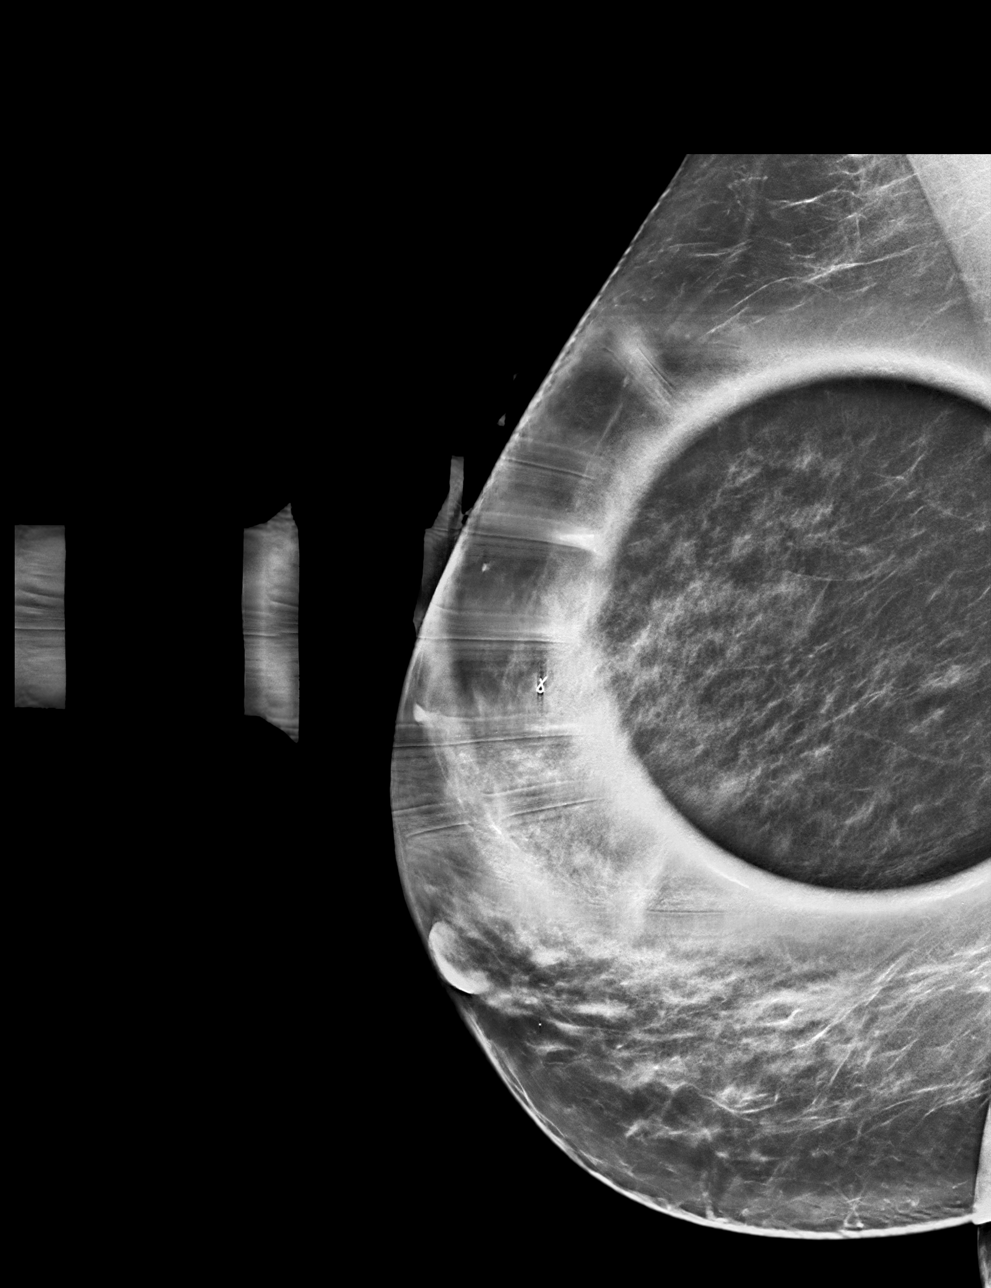

[R CC tomo · tomo slice 34/67.0]
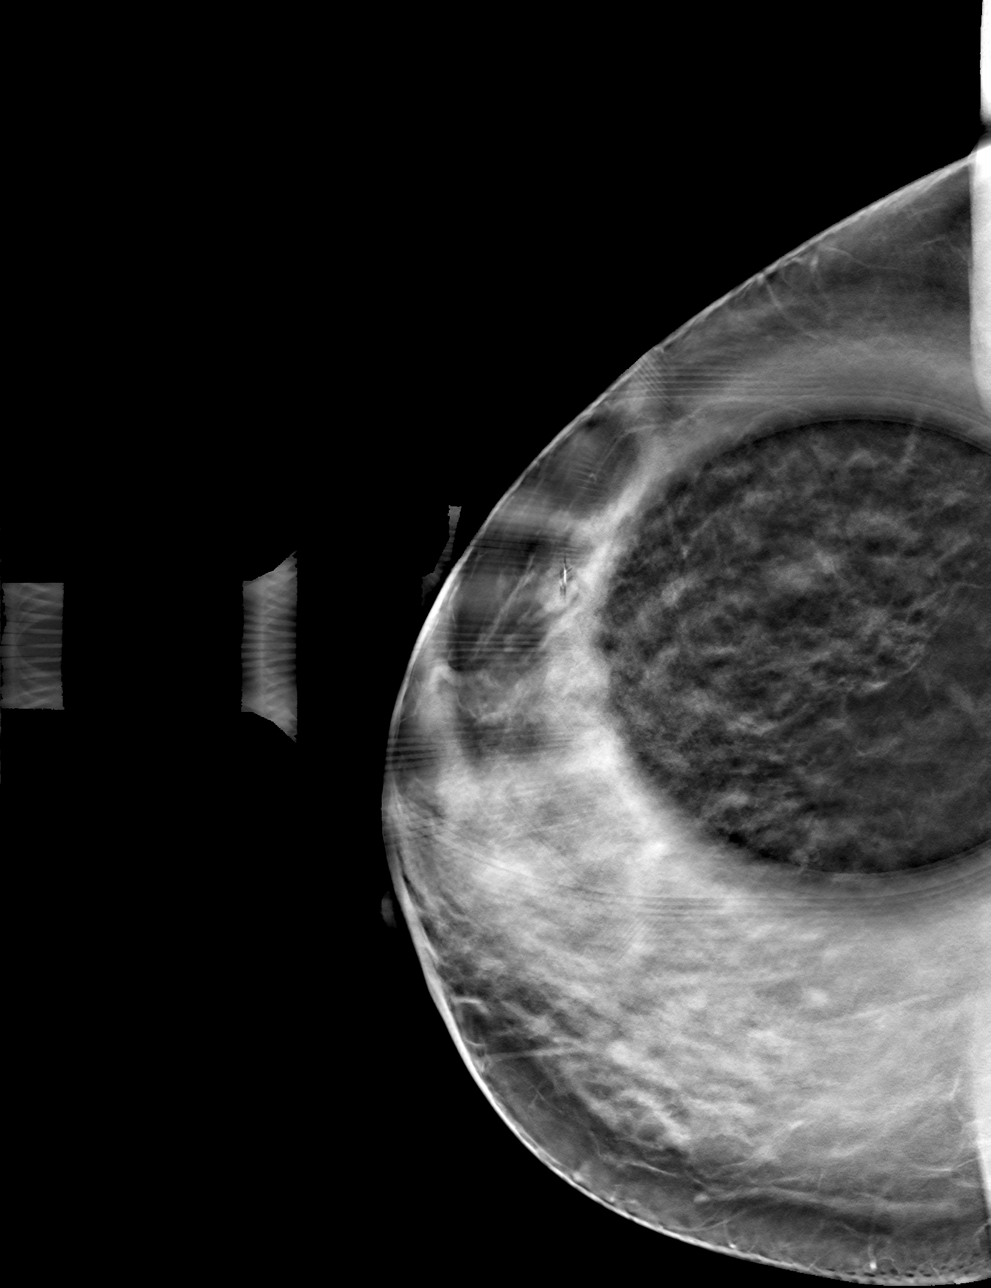

[R MLO tomo · tomo slice 34/67.0]
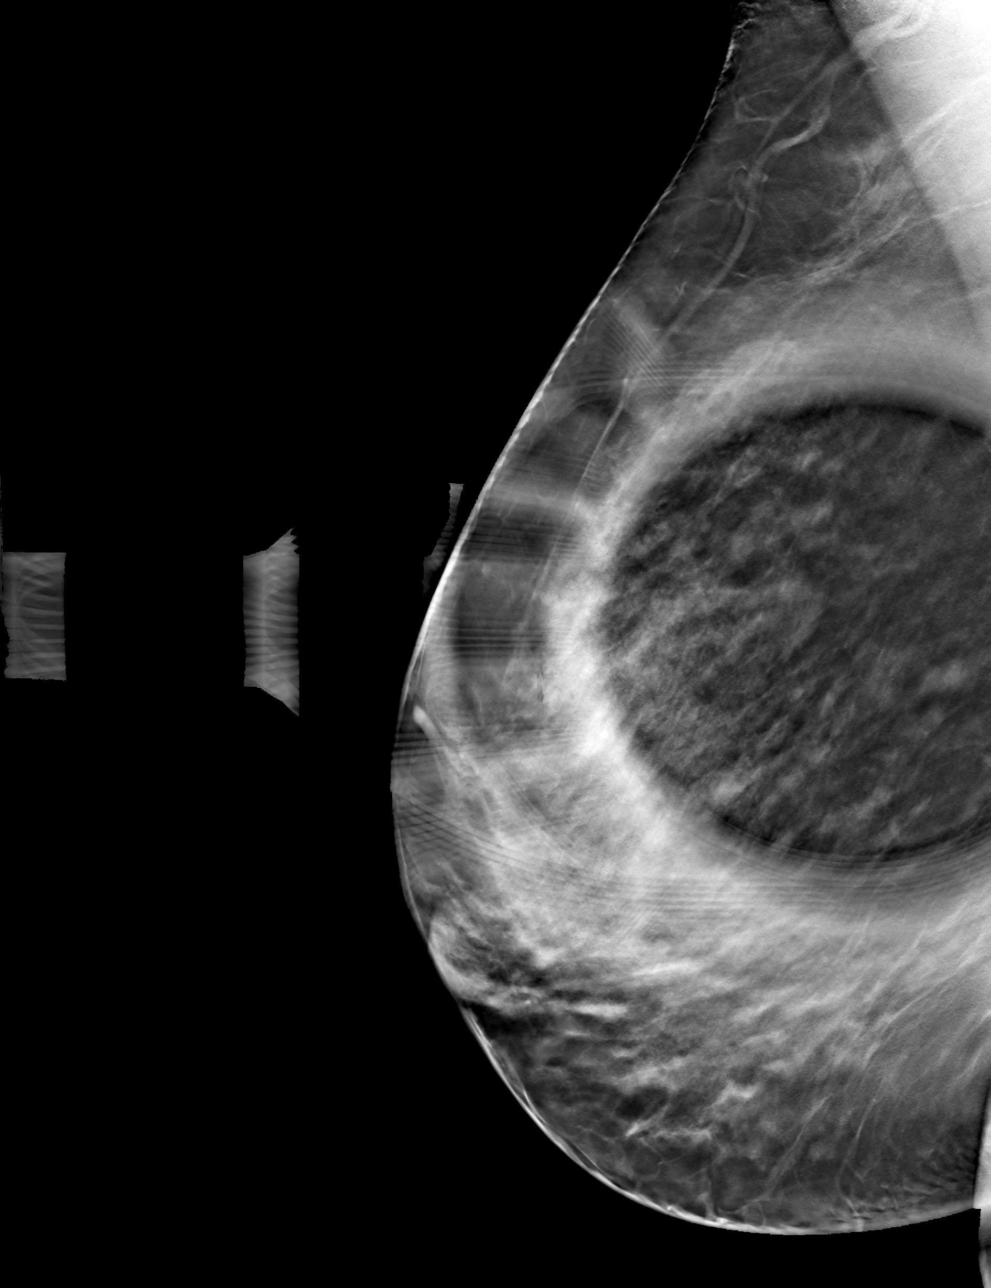

[4 of 12 positions shown; findings below may reference images not displayed]

ACR Breast Density Category c: The breast tissue is heterogeneously
dense, which may obscure small masses.
FINDINGS: Additional tomograms were performed of the right breast. There is an
oval circumscribed mass in the upper-outer right breast measuring
1.2 cm.

Targeted ultrasound of the right breast was performed demonstrating
several small areas of fibrocystic change. There is a cyst in the
right breast at 10 o'clock 4 cm from nipple measuring 1.2 x 0.7 x
1.2 cm. This corresponds well with the mass seen in the right breast
at mammography.
IMPRESSION: No findings of malignancy in the right breast.

RECOMMENDATION:
Screening mammogram in one year.(Code:WG-0-H9C)

I have discussed the findings and recommendations with the patient.
If applicable, a reminder letter will be sent to the patient
regarding the next appointment.

BI-RADS CATEGORY  2: Benign.

## 2022-09-06 IMAGING — US US BREAST*R* LIMITED INC AXILLA
1 series · 9 of 9 positions shown · non-contrast
Comparison: Previous exam(s).

CLINICAL DATA: Screening recall for right breast mass.

EXAM:
DIGITAL DIAGNOSTIC UNILATERAL RIGHT MAMMOGRAM WITH TOMOSYNTHESIS AND
CAD; ULTRASOUND RIGHT BREAST LIMITED
TECHNIQUE: Right digital diagnostic mammography and breast tomosynthesis was
performed. The images were evaluated with computer-aided detection.;
Targeted ultrasound examination of the right breast was performed

[Series 1: us breast*right* limited inc axilla · 0.07mm/px · 9 of 9 slices shown]
[im 1/9]
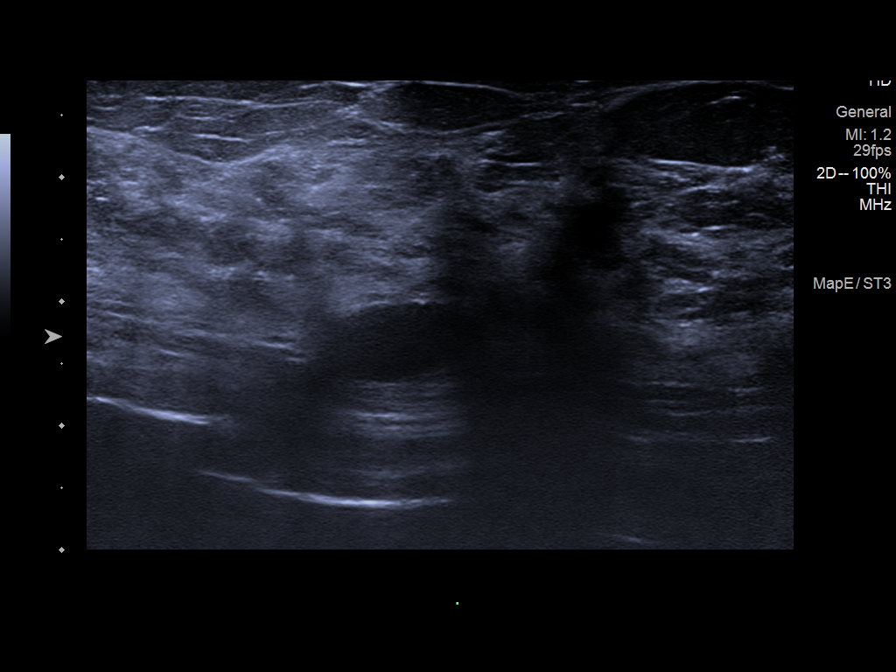
[im 2/9]
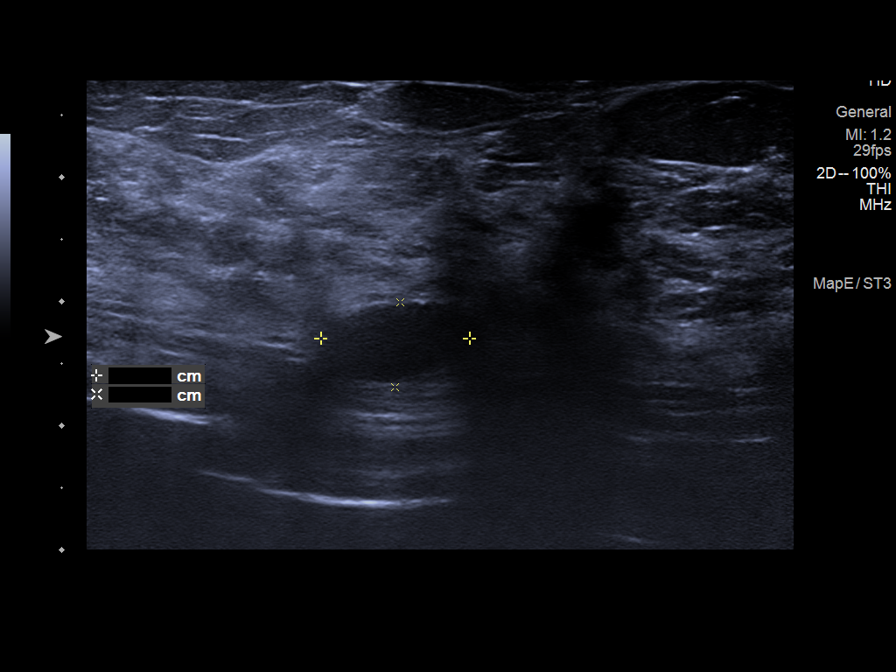
[im 3/9]
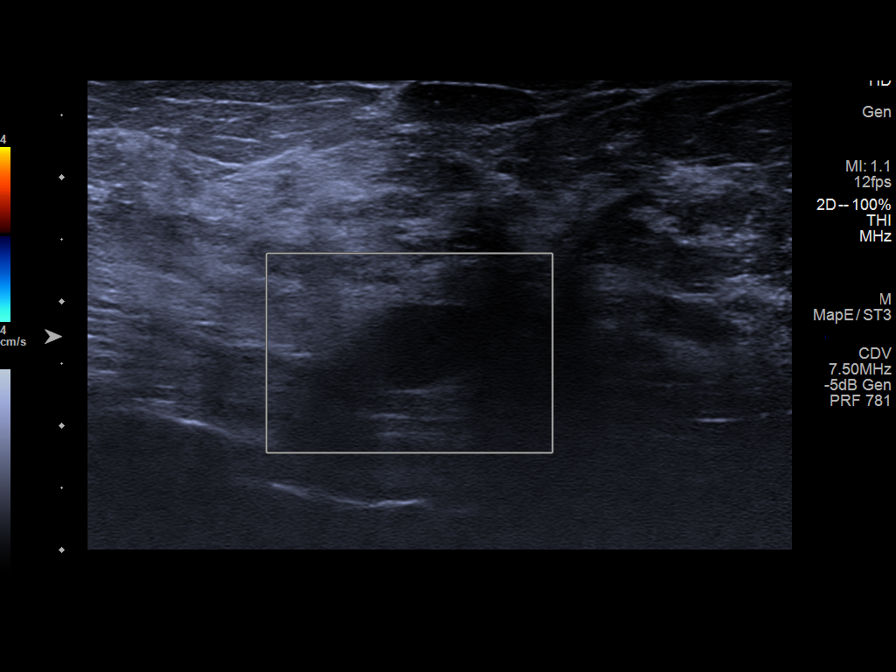
[im 4/9]
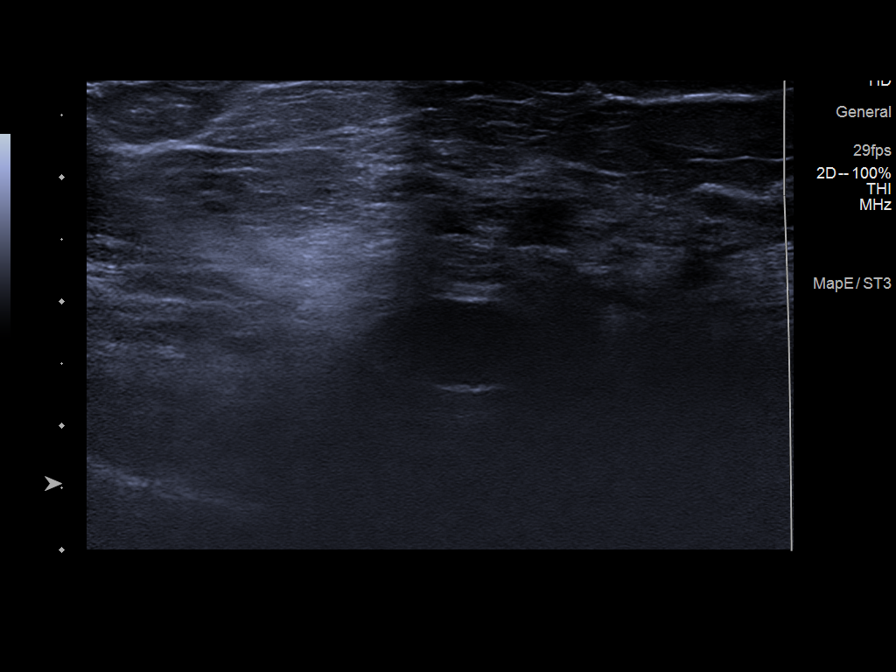
[im 5/9]
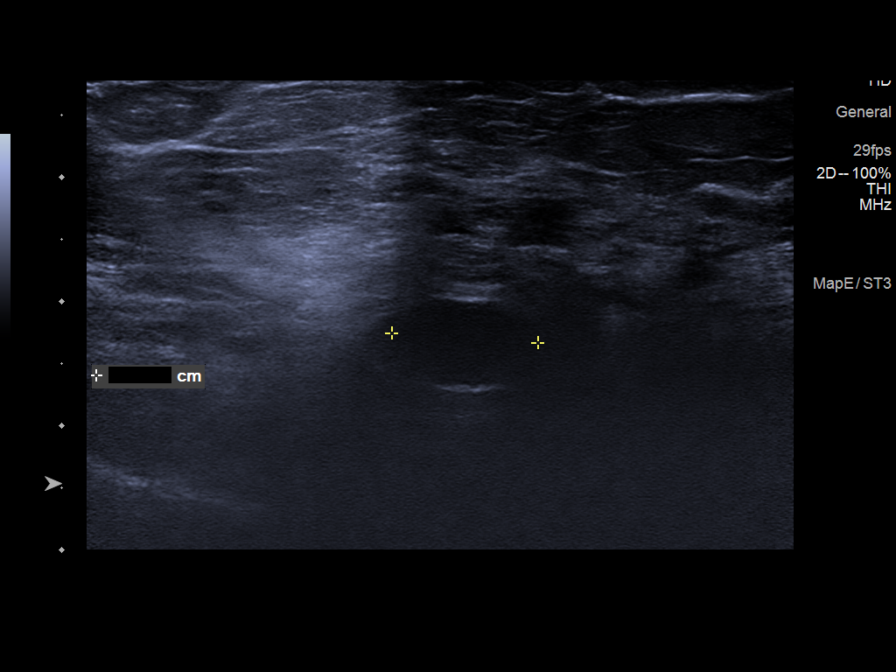
[im 6/9]
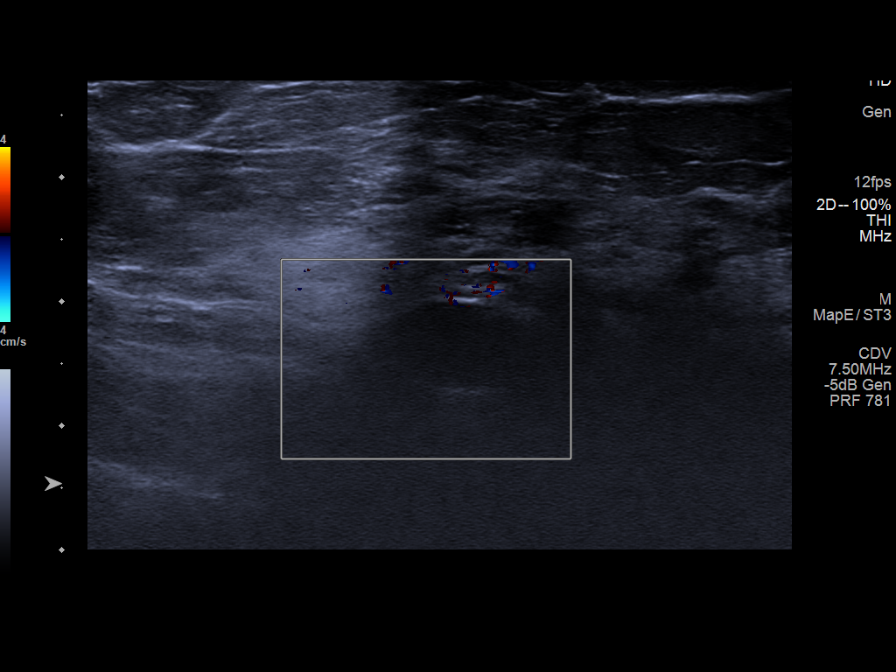
[im 7/9]
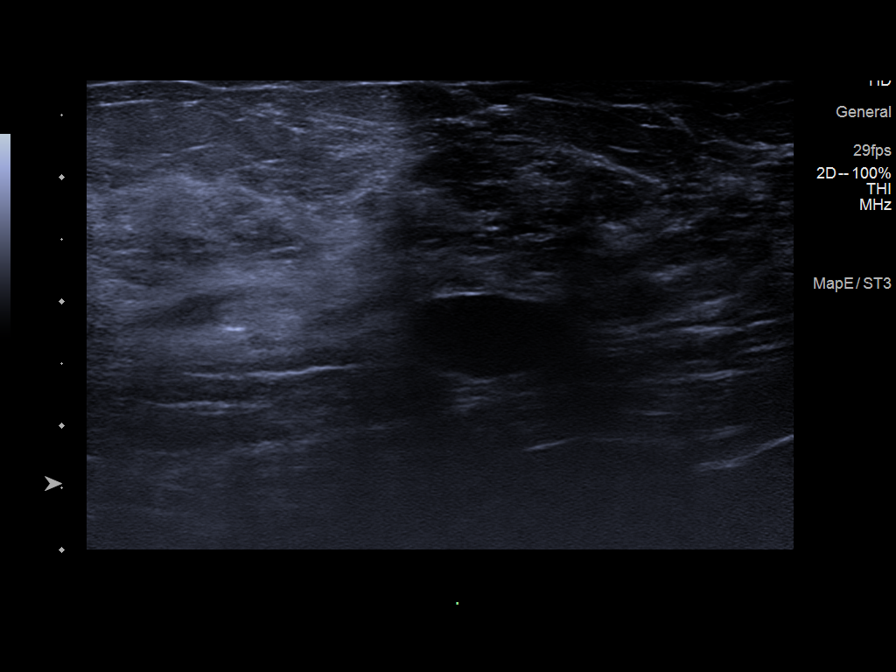
[im 8/9]
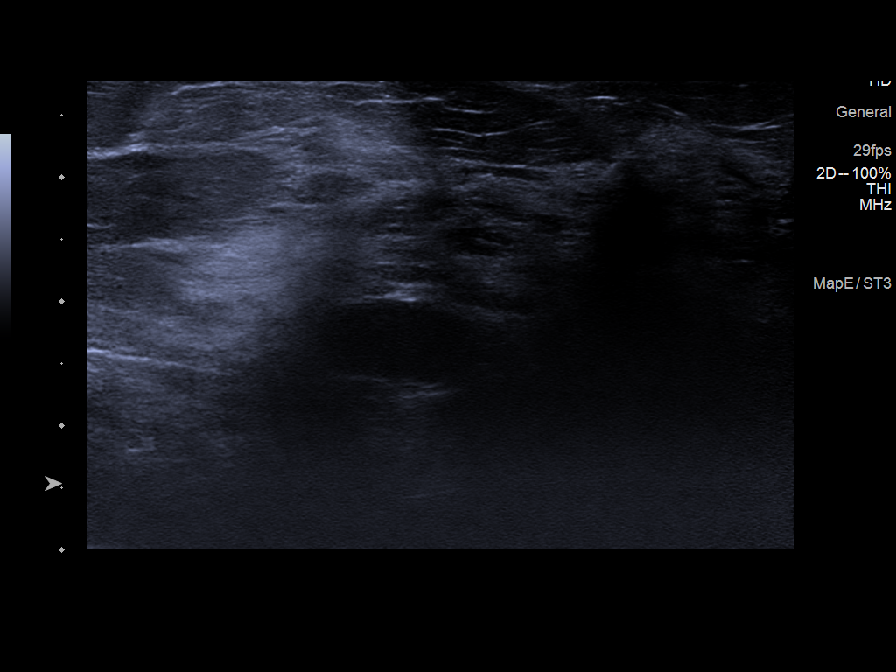
[im 9/9]
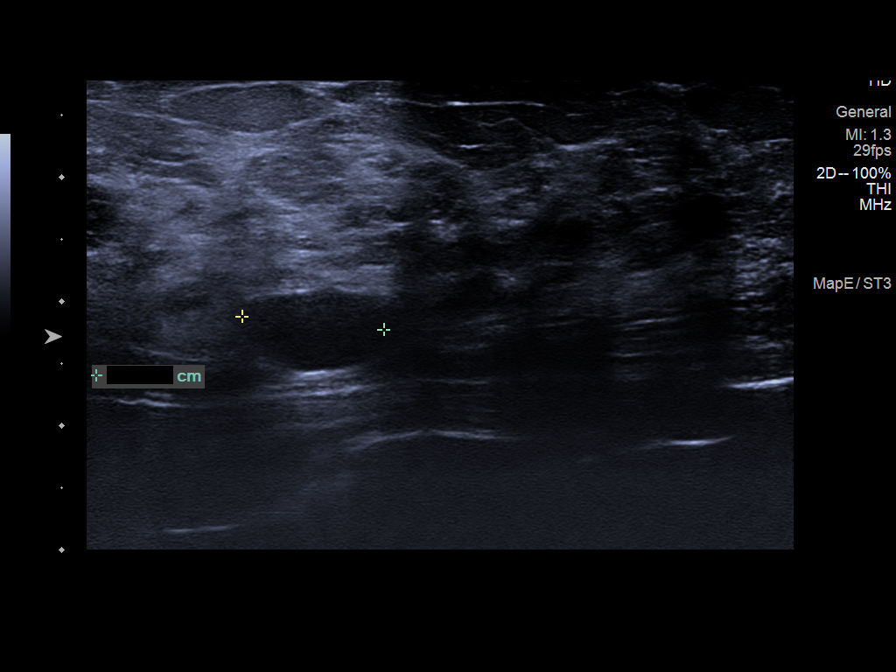

[9 of 9 positions shown; findings below may reference images not displayed]

ACR Breast Density Category c: The breast tissue is heterogeneously
dense, which may obscure small masses.
FINDINGS: Additional tomograms were performed of the right breast. There is an
oval circumscribed mass in the upper-outer right breast measuring
1.2 cm.

Targeted ultrasound of the right breast was performed demonstrating
several small areas of fibrocystic change. There is a cyst in the
right breast at 10 o'clock 4 cm from nipple measuring 1.2 x 0.7 x
1.2 cm. This corresponds well with the mass seen in the right breast
at mammography.
IMPRESSION: No findings of malignancy in the right breast.

RECOMMENDATION:
Screening mammogram in one year.(Code:WG-0-H9C)

I have discussed the findings and recommendations with the patient.
If applicable, a reminder letter will be sent to the patient
regarding the next appointment.

BI-RADS CATEGORY  2: Benign.

## 2022-09-12 ENCOUNTER — Encounter (HOSPITAL_COMMUNITY): Payer: Self-pay

## 2022-09-12 ENCOUNTER — Ambulatory Visit (HOSPITAL_COMMUNITY)
Admission: RE | Admit: 2022-09-12 | Discharge: 2022-09-12 | Disposition: A | Discharge status: Home / Self Care | Payer: Commercial Managed Care - PPO | Source: Ambulatory Visit | Attending: Adult Health | Admitting: Adult Health

## 2022-09-12 DIAGNOSIS — Z1231 Encounter for screening mammogram for malignant neoplasm of breast: Secondary | ICD-10-CM | POA: Diagnosis present

## 2022-09-16 ENCOUNTER — Ambulatory Visit (INDEPENDENT_AMBULATORY_CARE_PROVIDER_SITE_OTHER): Payer: Commercial Managed Care - PPO | Admitting: Adult Health

## 2022-09-16 ENCOUNTER — Encounter: Payer: Self-pay | Admitting: Adult Health

## 2022-09-16 VITALS — BP 134/86 | HR 78 | Ht 65.0 in | Wt 156.0 lb

## 2022-09-16 DIAGNOSIS — K649 Unspecified hemorrhoids: Secondary | ICD-10-CM

## 2022-09-16 DIAGNOSIS — Z79890 Hormone replacement therapy (postmenopausal): Secondary | ICD-10-CM

## 2022-09-16 DIAGNOSIS — Z1211 Encounter for screening for malignant neoplasm of colon: Secondary | ICD-10-CM

## 2022-09-16 DIAGNOSIS — Z01419 Encounter for gynecological examination (general) (routine) without abnormal findings: Secondary | ICD-10-CM

## 2022-09-16 LAB — HEMOCCULT GUIAC POC 1CARD (OFFICE): Fecal Occult Blood, POC: NEGATIVE

## 2022-09-16 MED ORDER — ESTRADIOL 1 MG PO TABS
1.0000 mg | ORAL_TABLET | Freq: Every day | ORAL | 4 refills | Status: DC
Start: 2022-09-16 — End: 2022-11-21

## 2022-09-16 MED ORDER — PROGESTERONE 200 MG PO CAPS: 200.0000 mg | ORAL_CAPSULE | Freq: Every day | ORAL | 4 refills | Status: DC

## 2022-09-16 NOTE — Progress Notes (Signed)
Patient ID: Samantha [name], female   DOB: 28-Dec-1965, 57 y.o.   MRN: [id number] History of Present Illness:  Samantha [name] is a 57 year old female, married, PM on HRT, in for a well woman gyn exam.  Last pap was negative HPV,NILM 5.17.23  PCP is [name]  Current Medications, Allergies, Past Medical History, Past Surgical History, Family History and Social History were reviewed in [employer] record.     Review of Systems:  Patient denies any headaches, hearing loss, fatigue, blurred vision, shortness of breath, chest pain, abdominal pain, problems with bowel movements, urination, or intercourse. No joint pain or mood swings.  She has hemorrhoids that bleed at times. Denies any vaginal bleeding   Physical Exam:BP 134/86 (BP Location: Left Arm, Patient Position: Sitting, Cuff Size: Normal)   Pulse 78   Ht 5\' 5"  (1.651 m)   Wt 156 lb (70.8 kg)   LMP 03/20/2016 (Approximate)   BMI 25.96 kg/m   General:  Well developed, well nourished, no acute distress Skin:  Warm and dry Neck:  Midline trachea, normal thyroid, good ROM, no lymphadenopathy Lungs; Clear to auscultation bilaterally Breast:  No dominant palpable mass, retraction, or nipple discharge Cardiovascular: Regular rate and rhythm Abdomen:  Soft, non tender, no hepatosplenomegaly Pelvic:  External genitalia is normal in appearance, no lesions.  The vagina is normal in appearance. Urethra has no lesions or masses. The cervix is bulbous.  Uterus is felt to be normal size, shape, and contour.  No adnexal masses or tenderness noted.Bladder is non tender, no masses felt. Rectal: Good sphincter tone, no polyps, o+ external and internal  hemorrhoids felt.  Hemoccult negative. Extremities/musculoskeletal:  No swelling or varicosities noted, no clubbing or cyanosis Psych:  No mood changes, alert and cooperative,seems happy AA is 0 Fall risk is low    09/16/2022    8:33 AM 09/04/2021    8:38 AM 09/03/2020    8:36  AM  Depression screen PHQ 2/9  Decreased Interest 0 0 3  Down, Depressed, Hopeless 0 0 3  PHQ - 2 Score 0 0 6  Altered sleeping 0 0 3  Tired, decreased energy 0 0 0  Change in appetite 0 0 3  Feeling bad or failure about yourself  0 0 0  Trouble concentrating 0 0 3  Moving slowly or fidgety/restless 0 0 0  Suicidal thoughts 0 0 0  PHQ-9 Score 0 0 15       09/16/2022    8:33 AM 09/04/2021    8:38 AM 09/03/2020    8:37 AM 08/02/2019    8:56 AM  GAD 7 : Generalized Anxiety Score  Nervous, Anxious, on Edge 0 0 3 3  Control/stop worrying 0 0 3 3  Worry too much - different things 0 0 3 3  Trouble relaxing 0 0 3 3  Restless 0 0 3 0  Easily annoyed or irritable 0 0 3 0  Afraid - awful might happen 0 0 3 0  Total GAD 7 Score 0 0 21 12  Anxiety Difficulty   Not difficult at all Not difficult at all      Upstream - 09/16/22 4098       Pregnancy Intention Screening   Does the patient want to become pregnant in the next year? N/A    Does the patient's partner want to become pregnant in the next year? N/A    Would the patient like to discuss contraceptive options today? N/A  Contraception Wrap Up   Current Method No Method - Other Reason   postmenopausal   Reason for No Current Contraceptive Method at Intake ([facility] Only) Other    End Method No Method - Other Reason   postmenopausal   Contraception Counseling Provided No              Upstream - 09/16/22 0839       Pregnancy Intention Screening   Does the patient want to become pregnant in the next year? N/A    Does the patient's partner want to become pregnant in the next year? N/A    Would the patient like to discuss contraceptive options today? N/A      Contraception Wrap Up   Current Method No Method - Other Reason   postmenopausal   Reason for No Current Contraceptive Method at Intake ([facility] Only) Other    End Method No Method - Other Reason   postmenopausal   Contraception Counseling Provided No             Examination chaperoned by Malachy Mood LPN  Impression and Plan: 1. Hemorrhoids, unspecified hemorrhoid type Sometimes hurts to sit Has seen bright red blood in past   Try preparation H or Anusol, if not helping will get cream compounded at [facility] She already eats fiber diet and drinks water, 6-8 bottles per day  2. Hormone replacement therapy (HRT) Happy with estrace and Prometrium, will continue  Denies any vaginal bleeding  Meds ordered this encounter  Medications   estradiol (ESTRACE) 1 MG tablet    Sig: Take 1 tablet (1 mg total) by mouth daily.    Dispense:  90 tablet    Refill:  4    Order Specific Question:   Supervising Provider    Answer:   Duane Lope H [[id number]]   progesterone (PROMETRIUM) 200 MG capsule    Sig: Take 1 capsule (200 mg total) by mouth daily. Take 1 capsule daily at bedtime    Dispense:  90 capsule    Refill:  4    Order Specific Question:   Supervising Provider    Answer:   Despina Hidden, LUTHER H [[id number]]     3. Encounter for screening fecal occult blood testing Hemoccult was negative   4. Encounter for well woman exam with routine gynecological exam Physical in 1 year Pap in 2026 Stay active Mammogram was 09/12/22, not final yet  Colonoscopy per [facility] at work

## 2022-11-21 ENCOUNTER — Other Ambulatory Visit: Payer: Self-pay | Admitting: Adult Health

## 2022-11-21 MED ORDER — ESTRADIOL 0.05 MG/24HR TD PTWK: 0.0500 mg | MEDICATED_PATCH | TRANSDERMAL | 12 refills | Status: DC

## 2022-11-21 NOTE — Progress Notes (Signed)
Stop po estrace will rx estradiol patch continue Prometrium at bedtime. But the patch is weekly

## 2023-07-28 ENCOUNTER — Encounter: Payer: Self-pay | Admitting: Obstetrics and Gynecology

## 2023-07-28 ENCOUNTER — Ambulatory Visit: Admitting: Obstetrics and Gynecology

## 2023-07-28 VITALS — Ht 65.0 in | Wt 162.4 lb

## 2023-07-28 DIAGNOSIS — N950 Postmenopausal bleeding: Secondary | ICD-10-CM

## 2023-07-28 LAB — POCT URINALYSIS DIPSTICK
Blood, UA: 4
Glucose, UA: NEGATIVE
Ketones, UA: NEGATIVE
Leukocytes, UA: NEGATIVE
Nitrite, UA: NEGATIVE
Protein, UA: NEGATIVE

## 2023-07-28 NOTE — Progress Notes (Signed)
   GYNECOLOGY PROGRESS NOTE  History:  58 y.o. [id number] presents to [facility] for postmenopausal bleeding. Reports symptoms started last Thursday. Reports bleeding on tissue when she wipes. Reports some cramping pain. No vaginal or pelvic pain, irritation. Is on progestin pill and patch daily. Will be going to [place]  4/9-4/18.Discussed via mychart with Azzie Bollman, NP- Has ultrasound scheduled   The following portions of the patient's history were reviewed and updated as appropriate: allergies, current medications, past family history, past medical history, past social history, past surgical history and problem list. Last pap smear on 09/04/21 was normal, neg HRHPV.  Health Maintenance Due  Topic Date Due   HIV Screening  Never done   Hepatitis C Screening  Never done   Zoster Vaccines- Shingrix (1 of 2) Never done   DTaP/Tdap/Td (2 - Tdap) 07/03/2014   COVID-19 Vaccine (2 - Pfizer risk series) 01/31/2020     Review of Systems:  Pertinent items are noted in HPI.   Objective:  Physical Exam Height 5\' 5"  (1.651 m), weight 162 lb 6.4 oz (73.7 kg), last menstrual period 03/20/2016. VS reviewed, nursing note reviewed,  Constitutional: well developed, well nourished, no distress HEENT: normocephalic Pulm/chest wall: normal effort Breast Exam: deferred Abdomen: soft Neuro: alert and oriented  Skin: warm, dry Psych: affect normal Pelvic exam:Pelvic: normal appearing vulva with no masses, tenderness or lesions  VAGINA: normal appearing vagina with normal color and discharge, no lesions  CERVIX:scant blood noted, no polyp noted no CMT    UTERUS: uterus is felt to be normal size, shape, consistency and nontender   ADNEXA: No adnexal masses or tenderness noted.  Extremities:  No swelling or varicosities noted   Assessment & Plan:  1. Postmenopausal bleeding (Primary) No polyp, lesion, noted on exam. UA normal, discussed likely differentials, will continue with ultrasound and follow  up with results  - POCT Urinalysis Dipstick   Susi Eric, FNP 9:52 AM

## 2023-08-01 DIAGNOSIS — N950 Postmenopausal bleeding: Secondary | ICD-10-CM | POA: Insufficient documentation

## 2023-08-10 ENCOUNTER — Other Ambulatory Visit: Payer: Self-pay | Admitting: Obstetrics and Gynecology

## 2023-08-10 DIAGNOSIS — N950 Postmenopausal bleeding: Secondary | ICD-10-CM

## 2023-08-11 ENCOUNTER — Ambulatory Visit

## 2023-08-11 DIAGNOSIS — N950 Postmenopausal bleeding: Secondary | ICD-10-CM | POA: Diagnosis not present

## 2023-08-11 NOTE — Progress Notes (Signed)
 PELVIC US  TA/TV: heterogeneous anteverted uterus with multiple fibroids,(#1) posterior subserosal fundal right 3.9 x 2.1 x 2.5 cm,(#2) anterior fundal subserosal fibroid 2.2 x 2 x 2.6 cm,echogenic avascular endometrial mass (? Polyp) 1.6 x 1.3 x .5 cm,EEC 4.6 mm,normal ovaries,ovaries appear mobile,no free fluid,no pain during ultrasound  Chaperone [place]

## 2023-08-13 ENCOUNTER — Other Ambulatory Visit: Payer: Self-pay | Admitting: Radiology

## 2023-08-24 ENCOUNTER — Other Ambulatory Visit (HOSPITAL_COMMUNITY): Payer: Self-pay | Admitting: Adult Health

## 2023-08-24 DIAGNOSIS — Z1231 Encounter for screening mammogram for malignant neoplasm of breast: Secondary | ICD-10-CM

## 2023-09-01 ENCOUNTER — Other Ambulatory Visit: Payer: Self-pay | Admitting: Obstetrics & Gynecology

## 2023-09-01 ENCOUNTER — Encounter: Payer: Self-pay | Admitting: Obstetrics & Gynecology

## 2023-09-01 DIAGNOSIS — N950 Postmenopausal bleeding: Secondary | ICD-10-CM

## 2023-09-01 DIAGNOSIS — D251 Intramural leiomyoma of uterus: Secondary | ICD-10-CM

## 2023-09-02 ENCOUNTER — Encounter: Payer: Self-pay | Admitting: Obstetrics & Gynecology

## 2023-09-02 ENCOUNTER — Ambulatory Visit

## 2023-09-02 ENCOUNTER — Ambulatory Visit: Admitting: Obstetrics & Gynecology

## 2023-09-02 VITALS — BP 164/94 | HR 78 | Ht 65.0 in | Wt 165.0 lb

## 2023-09-02 DIAGNOSIS — D251 Intramural leiomyoma of uterus: Secondary | ICD-10-CM | POA: Diagnosis not present

## 2023-09-02 DIAGNOSIS — R9389 Abnormal findings on diagnostic imaging of other specified body structures: Secondary | ICD-10-CM

## 2023-09-02 DIAGNOSIS — Z79890 Hormone replacement therapy (postmenopausal): Secondary | ICD-10-CM

## 2023-09-02 DIAGNOSIS — N840 Polyp of corpus uteri: Secondary | ICD-10-CM | POA: Diagnosis not present

## 2023-09-02 DIAGNOSIS — N950 Postmenopausal bleeding: Secondary | ICD-10-CM | POA: Diagnosis not present

## 2023-09-02 MED ORDER — ESTRADIOL 0.0375 MG/24HR TD PTWK: 0.0375 mg | MEDICATED_PATCH | TRANSDERMAL | 6 refills | Status: DC

## 2023-09-02 NOTE — Progress Notes (Signed)
 GYN VISIT Patient name: Samantha [name] MRN [id number]  Date of birth: 1965-12-02 Chief Complaint:   No chief complaint on file.  History of Present Illness:   Samantha [name] is a 58 y.o. [phone] PM female being seen today for PMB/thickened endometrium  Notes she had about a week episode of spotting when she wiped.  This has since resolved.  Of note, pt has been on HRT for many years.  Transitioned from contraception to HRT.  Denies vaginal discharge, itching or irritation.  Denies pelvic or abdominal pain  Patient's last menstrual period was 03/20/2016 (approximate).    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 Physical Assessment:   Vitals:   09/02/23 1133 09/02/23 1221  BP: (!) 156/86 (!) 164/94  Pulse: 90 78  Weight: 165 lb (74.8 kg)   Height: 5\' 5"  (1.651 m)   Body mass index is 27.46 kg/m.       Physical Examination:   General appearance: alert, well appearing, and in no distress  Psych: mood appropriate, normal affect  Skin: warm & dry   Cardiovascular: normal heart rate noted  Respiratory: normal respiratory effort, no distress  Abdomen: soft, non-tender   Pelvic: VULVA: normal appearing vulva with no masses,  tenderness or lesions, VAGINA: normal appearing vagina with normal color and discharge, no lesions, CERVIX: normal appearing cervix without discharge or lesions  Extremities: no edema   Chaperone: [name]    US [name]: Sonohysterogram was preformed by Dr. [name], with ultrasound guidance and surveillance throughout the procedure.The saline outlined a 1.8 x .7 x 1.5 cm echogenic polyp within the endometrial cavity. EEC 3.4 mm,heterogeneous anteverted uterus with multiple fibroids,largest fibroids (#1) posterior right 3.5 x 1.6 x 2.6 cm,(#2) 2.5 x 1.6 x 1.7 cm,normal ovaries,no free fluid,no pain during procedure    Assessment & Plan:  1) Postmenopausal bleeding, Uterine polyp -discussed findings on SHG suggestive of endometrial polyp -recommendation for HSC, D&C, polypectomy.  Reviewed risk, benefits including but not limited to risk of bleeding, infection, uterine perforation requiring further surgical intervention.  Discussed hospital expectations and recovery - Alternative to hysteroscopy polypectomy would be for EMB completed here in office - Questions and concerns were addressed and patient does desire to proceed, referral created for June 3 -medication list reviewed pt to stop phentermine  1wk prior to surgery  2) HRT -discussed pros/cons of continuing with this medication -pt has been asymptomatic for many years -will plan to slowly start weening- first step lower patch []  plan for follow up in 3 mos  Orders Placed This Encounter  Procedures   Ambulatory Referral For Surgery Scheduling   Meds ordered this encounter  Medications   estradiol  (CLIMARA ) 0.0375 mg/24hr patch    Sig:  Place 1 patch (0.0375 mg total) onto the skin once a week.    Dispense:  4 patch    Refill:  6     Return in about 3 months (around 12/03/2023) for medication follow up/annual.   October Peery, DO Attending Obstetrician & Gynecologist, [facility], [facility]

## 2023-09-02 NOTE — Progress Notes (Signed)
 US  TV sonohysterogram: Sonohysterogram was preformed by Dr. Ozan, with ultrasound guidance and surveillance throughout the procedure.The saline outlined a 1.8 x .7 x 1.5 cm echogenic polyp within the endometrial cavity. EEC 3.4 mm,heterogeneous anteverted uterus with multiple fibroids,largest fibroids (#1) posterior right 3.5 x 1.6 x 2.6 cm,(#2) 2.5 x 1.6 x 1.7 cm,normal ovaries,no free fluid,no pain during procedure  Chaperone:[employer]

## 2023-09-11 ENCOUNTER — Encounter: Payer: Self-pay | Admitting: Obstetrics & Gynecology

## 2023-09-16 NOTE — Patient Instructions (Signed)
 Samantha [name]  09/16/2023     @PREFPERIOPPHARMACY @   Your procedure is scheduled on  09/22/2023.   Report to [facility] at  [phone]  A.M.   Call this number if you have problems the morning of surgery:  [phone]  If you experience any cold or flu symptoms such as cough, fever, chills, shortness of breath, etc. between now and your scheduled surgery, please notify us  at the above number.   Remember:  Do not eat after midnight.    You may drink clear liquids until 0655 am on 09/22/2023.   Clear liquids allowed are:                    Water, Juice (No red color; non-citric and without pulp; diabetics please choose diet or no sugar options), Carbonated beverages (diabetics please choose diet or no sugar options), Clear Tea (No creamer, milk, or cream, including half & half and powdered creamer), Black Coffee Only (No creamer, milk or cream, including half & half and powdered creamer), and Clear Sports drink (No red color; diabetics please choose diet or no sugar options)    Take these medicines the morning of surgery with A SIP OF WATER                                                             None.    Do not wear jewelry, make-up or nail polish, including gel polish,  artificial nails, or any other type of covering on natural nails (fingers and  toes).  Do not wear lotions, powders, or perfumes, or deodorant.  Do not shave 48 hours prior to surgery.  Men may shave face and neck.  Do not bring valuables to the hospital.  [facility] is not responsible for any belongings or valuables.  Contacts, dentures or bridgework may not be worn into surgery.  Leave your suitcase in the car.  After surgery it may be brought to your room.  For patients admitted to the hospital, discharge time will be determined by your treatment team.  Patients discharged the day of surgery will not be allowed to drive home and must h ave someone with them for 24 hours.    Special instructions:   DO  NOT smoke tobacco or vape for 24 hours before your procedure.  Please read over the following fact sheets that you were given. Coughing and Deep Breathing, Surgical Site Infection Prevention, Anesthesia Post-op Instructions, and Care and Recovery After Surgery        Dilation and Curettage or Vacuum Curettage, Care After The following information offers guidance on how to care for yourself after your procedure. Your doctor may also give you more specific instructions. If you have problems or questions, contact your doctor. What can I expect after the procedure? After the procedure, it is common to have: Mild pain or cramps. Some bleeding or spotting from the vagina. These may last for up to 2 weeks. Follow these instructions at home: Medicines Take over-the-counter and prescription medicines only as told by your doctor. If told, take steps to prevent problems with pooping (constipation). You may need to: Drink enough fluid to keep your pee (urine) pale yellow. Take medicines. You will be told what medicines to take. Eat foods  that are high in fiber. These include beans, whole grains, and fresh fruits and vegetables. Limit foods that are high in fat and sugar. These include fried or sweet foods. Ask your doctor if you should avoid driving or using machines while you are taking your medicine. Activity  If you were given a medicine to help you relax (sedative) during your procedure, it can affect you for many hours. Do not drive or use machinery until your doctor says that it is safe. Rest as told by your doctor. Get up to take short walks every 1-2 hours. Ask for help if you feel weak or unsteady. Do not lift anything that is heavier than 10 lb (4.5 kg), or the limit that you are told. Return to your normal activities when your doctor says that it is safe. Lifestyle For at least 2 weeks, or as long as told by your doctor: Do not douche. Do not use tampons. Do not have sex. General  instructions Do not take baths, swim, or use a hot tub. Ask your doctor if you may take showers. Do not smoke or use any products that contain nicotine or tobacco. These can delay healing. If you need help quitting, ask your doctor. Wear compression stockings as told by your doctor. It is up to you to get the results of your procedure. Ask how to get your results when they are ready. Keep all follow-up visits. Contact a doctor if: You have very bad cramps that get worse or do not get better with medicine. You have very bad pain in your belly (abdomen). You cannot drink fluids without vomiting. You have pain in the area just above your thighs. You have fluid from your vagina that smells bad. You have a rash. Get help right away if: You are bleeding a lot from your vagina. This means soaking more than one sanitary pad in 1 hour, and this happens for 2 hours in a row. You have a fever that is above 100.21F (38C). Your belly feels very tender or hard. You have chest pain. You have trouble breathing. You feel dizzy or light-headed. You faint. You have pain in your neck or shoulder area. These symptoms may be an emergency. Get help right away. Call your local emergency services (911 in the [place] Do not wait to see if the symptoms will go away. Do not drive yourself to the hospital. Summary After your procedure, it is common to have pain or cramping. It is also common to have bleeding or spotting from your vagina. Rest as told. Get up to take short walks every 1-2 hours. Do not lift anything that is heavier than 10 lb (4.5 kg), or the limit that you are told. Get help right away if you have problems from the procedure. Ask your doctor what problems to watch for. This information is not intended to replace advice given to you by your health care provider. Make sure you discuss any questions you have with your health care provider. Document Revised: 03/26/2020 Document Reviewed:  03/28/2020 Elsevier Patient Education  2024 [employer]General Anesthesia, Adult, Care After The following information offers guidance on how to care for yourself after your procedure. Your health care provider may also give you more specific instructions. If you have problems or questions, contact your health care provider. What can I expect after the procedure? After the procedure, it is common for people to: Have pain or discomfort at the IV site. Have nausea or vomiting. Have a sore throat  or hoarseness. Have trouble concentrating. Feel cold or chills. Feel weak, sleepy, or tired (fatigue). Have soreness and body aches. These can affect parts of the body that were not involved in surgery. Follow these instructions at home: For the time period you were told by your health care provider:  Rest. Do not participate in activities where you could fall or become injured. Do not drive or use machinery. Do not drink alcohol. Do not take sleeping pills or medicines that cause drowsiness. Do not make important decisions or sign legal documents. Do not take care of children on your own. General instructions Drink enough fluid to keep your urine pale yellow. If you have sleep apnea, surgery and certain medicines can increase your risk for breathing problems. Follow instructions from your health care provider about wearing your sleep device: Anytime you are sleeping, including during daytime naps. While taking prescription pain medicines, sleeping medicines, or medicines that make you drowsy. Return to your normal activities as told by your health care provider. Ask your health care provider what activities are safe for you. Take over-the-counter and prescription medicines only as told by your health care provider. Do not use any products that contain nicotine or tobacco. These products include cigarettes, chewing tobacco, and vaping devices, such as e-cigarettes. These can delay incision  healing after surgery. If you need help quitting, ask your health care provider. Contact a health care provider if: You have nausea or vomiting that does not get better with medicine. You vomit every time you eat or drink. You have pain that does not get better with medicine. You cannot urinate or have bloody urine. You develop a skin rash. You have a fever. Get help right away if: You have trouble breathing. You have chest pain. You vomit blood. These symptoms may be an emergency. Get help right away. Call 911. Do not wait to see if the symptoms will go away. Do not drive yourself to the hospital. Summary After the procedure, it is common to have a sore throat, hoarseness, nausea, vomiting, or to feel weak, sleepy, or fatigue. For the time period you were told by your health care provider, do not drive or use machinery. Get help right away if you have difficulty breathing, have chest pain, or vomit blood. These symptoms may be an emergency. This information is not intended to replace advice given to you by your health care provider. Make sure you discuss any questions you have with your health care provider. Document Revised: 07/05/2021 Document Reviewed: 07/05/2021 Elsevier Patient Education  2024 [employer].How to Use Chlorhexidine at Home in the Shower Chlorhexidine gluconate (CHG) is a germ-killing (antiseptic) wash that's used to clean the skin. It can get rid of the germs that normally live on the skin and can keep them away for about 24 hours. If you're having surgery, you may be told to shower with CHG at home the night before surgery. This can help lower your risk for infection. To use CHG wash in the shower, follow the steps below. Supplies needed: CHG body wash. Clean washcloth. Clean towel. How to use CHG in the shower Follow these steps unless you're told to use CHG in a different way: Start the shower. Use your normal soap and shampoo to wash your face and  hair. Turn off the shower or move out of the shower stream. Pour CHG onto a clean washcloth. Do not use any type of brush or rough sponge. Start at your neck, washing your body down  to your toes. Make sure you: Wash the part of your body where the surgery will be done for at least 1 minute. Do not scrub. Do not use CHG on your head or face unless your health care provider tells you to. If it gets into your ears or eyes, rinse them well with water. Do not wash your genitals with CHG. Wash your back and under your arms. Make sure to wash skin folds. Let the CHG sit on your skin for 1-2 minutes or as long as told. Rinse your entire body in the shower, including all body creases and folds. Turn off the shower. Dry off with a clean towel. Do not put anything on your skin afterward, such as powder, lotion, or perfume. Put on clean clothes or pajamas. If it's the night before surgery, sleep in clean sheets. General tips Use CHG only as told, and follow the instructions on the label. Use the full amount of CHG as told. This is often one bottle. Do not smoke and stay away from flames after using CHG. Your skin may feel sticky after using CHG. This is normal. The sticky feeling will go away as the CHG dries. Do not use CHG: If you have a chlorhexidine allergy or have reacted to chlorhexidine in the past. On open wounds or areas of skin that have broken skin, cuts, or scrapes. On babies younger than 65 months of age. Contact a health care provider if: You have questions about using CHG. Your skin gets irritated or itchy. You have a rash after using CHG. You swallow any CHG. Call your local poison control center [phone] in the [place] Your eyes itch badly, or they become very red or swollen. Your hearing changes. You have trouble seeing. If you can't reach your provider, go to an urgent care or emergency room. Do not drive yourself. Get help right away if: You have swelling or tingling in  your mouth or throat. You make high-pitched whistling sounds when you breathe, most often when you breathe out (wheeze). You have trouble breathing. These symptoms may be an emergency. Call 911 right away. Do not wait to see if the symptoms will go away. Do not drive yourself to the hospital. This information is not intended to replace advice given to you by your health care provider. Make sure you discuss any questions you have with your health care provider. Document Revised: 10/21/2022 Document Reviewed: 10/17/2021 Elsevier Patient Education  2024 [employer].

## 2023-09-17 ENCOUNTER — Encounter (HOSPITAL_COMMUNITY)
Admission: RE | Admit: 2023-09-17 | Discharge: 2023-09-17 | Disposition: A | Discharge status: Home / Self Care | Source: Ambulatory Visit | Attending: Obstetrics & Gynecology | Admitting: Obstetrics & Gynecology

## 2023-09-17 VITALS — BP 133/81 | HR 89 | Temp 98.0°F | Resp 19 | Ht 65.0 in | Wt 164.9 lb

## 2023-09-17 DIAGNOSIS — Z79899 Other long term (current) drug therapy: Secondary | ICD-10-CM | POA: Insufficient documentation

## 2023-09-17 DIAGNOSIS — Z01818 Encounter for other preprocedural examination: Secondary | ICD-10-CM | POA: Insufficient documentation

## 2023-09-17 DIAGNOSIS — I10 Essential (primary) hypertension: Secondary | ICD-10-CM | POA: Diagnosis not present

## 2023-09-17 DIAGNOSIS — N950 Postmenopausal bleeding: Secondary | ICD-10-CM | POA: Insufficient documentation

## 2023-09-17 LAB — BASIC METABOLIC PANEL WITH GFR
Anion gap: 10 (ref 5–15)
BUN: 11 mg/dL (ref 6–20)
CO2: 26 mmol/L (ref 22–32)
Calcium: 9.7 mg/dL (ref 8.9–10.3)
Chloride: 101 mmol/L (ref 98–111)
Creatinine, Ser: 0.84 mg/dL (ref 0.44–1.00)
GFR, Estimated: >60 mL/min (ref >60)
Glucose, Bld: 106 mg/dL — ABNORMAL HIGH (ref 70–99)
Potassium: 3.6 mmol/L (ref 3.5–5.1)
Sodium: 137 mmol/L (ref 135–145)

## 2023-09-17 LAB — CBC
HCT: 42.4 % (ref 36.0–46.0)
Hemoglobin: 14.3 g/dL (ref 12.0–15.0)
MCH: 30.8 pg (ref 26.0–34.0)
MCHC: 33.7 g/dL (ref 30.0–36.0)
MCV: 91.2 fL (ref 80.0–100.0)
Platelets: 330 10*3/uL (ref 150–400)
RBC: 4.65 MIL/uL (ref 3.87–5.11)
RDW: 13.1 % (ref 11.5–15.5)
WBC: 9.2 10*3/uL (ref 4.0–10.5)
nRBC: 0 % (ref 0.0–0.2)

## 2023-09-18 ENCOUNTER — Ambulatory Visit (HOSPITAL_COMMUNITY)
Admission: RE | Admit: 2023-09-18 | Discharge: 2023-09-18 | Disposition: A | Discharge status: Home / Self Care | Source: Ambulatory Visit | Attending: Adult Health | Admitting: Adult Health

## 2023-09-18 DIAGNOSIS — Z1231 Encounter for screening mammogram for malignant neoplasm of breast: Secondary | ICD-10-CM | POA: Insufficient documentation

## 2023-09-22 ENCOUNTER — Encounter (HOSPITAL_COMMUNITY)
Admission: RE | Disposition: A | Discharge status: Home / Self Care | Payer: Self-pay | Source: Home / Self Care | Attending: Obstetrics & Gynecology

## 2023-09-22 ENCOUNTER — Ambulatory Visit (HOSPITAL_COMMUNITY): Admitting: Anesthesiology

## 2023-09-22 ENCOUNTER — Encounter (HOSPITAL_COMMUNITY): Payer: Self-pay | Admitting: Obstetrics & Gynecology

## 2023-09-22 ENCOUNTER — Ambulatory Visit (HOSPITAL_COMMUNITY)
Admission: RE | Admit: 2023-09-22 | Discharge: 2023-09-22 | Disposition: A | Discharge status: Home / Self Care | Attending: Obstetrics & Gynecology | Admitting: Obstetrics & Gynecology

## 2023-09-22 DIAGNOSIS — I10 Essential (primary) hypertension: Secondary | ICD-10-CM | POA: Insufficient documentation

## 2023-09-22 DIAGNOSIS — Z01818 Encounter for other preprocedural examination: Secondary | ICD-10-CM

## 2023-09-22 DIAGNOSIS — N840 Polyp of corpus uteri: Secondary | ICD-10-CM

## 2023-09-22 DIAGNOSIS — N950 Postmenopausal bleeding: Secondary | ICD-10-CM | POA: Diagnosis not present

## 2023-09-22 HISTORY — PX: HYSTEROSCOPY WITH D & C: SHX1775

## 2023-09-22 HISTORY — PX: CERVICAL POLYPECTOMY: SHX88

## 2023-09-22 SURGERY — DILATATION AND CURETTAGE /HYSTEROSCOPY
Anesthesia: General | Site: Vagina

## 2023-09-22 MED ORDER — ONDANSETRON HCL 4 MG/2ML IJ SOLN: 4.0000 mg | Freq: Once | INTRAMUSCULAR | Status: DC | PRN

## 2023-09-22 MED ORDER — LIDOCAINE-EPINEPHRINE 0.5 %-1:200000 IJ SOLN
INTRAMUSCULAR | Status: AC
  Filled 2023-09-22: qty 50

## 2023-09-22 MED ORDER — FENTANYL CITRATE PF 50 MCG/ML IJ SOSY
25.0000 ug | PREFILLED_SYRINGE | INTRAMUSCULAR | Status: DC | PRN
  Administered 2023-09-22 (×2): 50 ug via INTRAVENOUS
  Filled 2023-09-22 (×2): qty 1

## 2023-09-22 MED ORDER — LIDOCAINE 2% (20 MG/ML) 5 ML SYRINGE
INTRAMUSCULAR | Status: DC | PRN
  Administered 2023-09-22: 60 mg via INTRAVENOUS

## 2023-09-22 MED ORDER — OXYCODONE HCL 5 MG PO TABS: 5.0000 mg | ORAL_TABLET | Freq: Four times a day (QID) | ORAL | 0 refills | Status: AC | PRN

## 2023-09-22 MED ORDER — SODIUM CHLORIDE 0.9 % IR SOLN
Status: DC | PRN
  Administered 2023-09-22: 3000 mL

## 2023-09-22 MED ORDER — POVIDONE-IODINE 10 % EX SWAB
2.0000 | Freq: Once | CUTANEOUS | Status: AC
  Administered 2023-09-22: 2 via TOPICAL

## 2023-09-22 MED ORDER — LACTATED RINGERS IV SOLN: INTRAVENOUS | Status: DC

## 2023-09-22 MED ORDER — ONDANSETRON HCL 4 MG/2ML IJ SOLN
INTRAMUSCULAR | Status: DC | PRN
  Administered 2023-09-22: 4 mg via INTRAVENOUS

## 2023-09-22 MED ORDER — CHLORHEXIDINE GLUCONATE 0.12 % MT SOLN
15.0000 mL | Freq: Once | OROMUCOSAL | Status: AC
  Administered 2023-09-22: 15 mL via OROMUCOSAL
  Filled 2023-09-22: qty 15

## 2023-09-22 MED ORDER — PROPOFOL 10 MG/ML IV BOLUS
INTRAVENOUS | Status: AC
  Filled 2023-09-22: qty 20

## 2023-09-22 MED ORDER — DEXAMETHASONE SODIUM PHOSPHATE 10 MG/ML IJ SOLN
INTRAMUSCULAR | Status: DC | PRN
Start: 2023-09-22 — End: 2023-09-22
  Administered 2023-09-22: 10 mg via INTRAVENOUS

## 2023-09-22 MED ORDER — LIDOCAINE-EPINEPHRINE 0.5 %-1:200000 IJ SOLN
INTRAMUSCULAR | Status: DC | PRN
  Administered 2023-09-22: 20 mL

## 2023-09-22 MED ORDER — OXYCODONE HCL 5 MG/5ML PO SOLN: 5.0000 mg | Freq: Once | ORAL | Status: DC | PRN

## 2023-09-22 MED ORDER — FENTANYL CITRATE (PF) 100 MCG/2ML IJ SOLN
INTRAMUSCULAR | Status: DC | PRN
  Administered 2023-09-22: 25 ug via INTRAVENOUS
  Administered 2023-09-22: 50 ug via INTRAVENOUS
  Administered 2023-09-22: 25 ug via INTRAVENOUS

## 2023-09-22 MED ORDER — ORAL CARE MOUTH RINSE: 15.0000 mL | Freq: Once | OROMUCOSAL | Status: AC

## 2023-09-22 MED ORDER — FENTANYL CITRATE (PF) 100 MCG/2ML IJ SOLN
INTRAMUSCULAR | Status: AC
  Filled 2023-09-22: qty 2

## 2023-09-22 MED ORDER — PROPOFOL 10 MG/ML IV BOLUS
INTRAVENOUS | Status: DC | PRN
  Administered 2023-09-22: 200 mg via INTRAVENOUS

## 2023-09-22 MED ORDER — OXYCODONE HCL 5 MG PO TABS: 5.0000 mg | ORAL_TABLET | Freq: Once | ORAL | Status: DC | PRN

## 2023-09-22 SURGICAL SUPPLY — 26 items
CLOTH BEACON ORANGE TIMEOUT ST (SAFETY) ×1 IMPLANT
COVER LIGHT HANDLE (MISCELLANEOUS) IMPLANT
COVER LIGHT HANDLE STERIS (MISCELLANEOUS) ×3 IMPLANT
DEVICE MYOSURE LITE (MISCELLANEOUS) IMPLANT
GAUZE 4X4 16PLY ~~LOC~~+RFID DBL (SPONGE) ×2 IMPLANT
GLOVE BIO SURGEON STRL SZ 6.5 (GLOVE) ×1 IMPLANT
GLOVE BIO SURGEON STRL SZ7 (GLOVE) IMPLANT
GLOVE BIOGEL PI IND STRL 7.0 (GLOVE) ×3 IMPLANT
GOWN STRL REUS W/ TWL LRG LVL3 (GOWN DISPOSABLE) ×1 IMPLANT
GOWN STRL REUS W/TWL LRG LVL3 (GOWN DISPOSABLE) ×1 IMPLANT
KIT PROCEDURE FLUENT (KITS) ×1 IMPLANT
KIT TURNOVER CYSTO (KITS) ×1 IMPLANT
KIT TURNOVER KIT A (KITS) ×1 IMPLANT
NS IRRIG 1000ML POUR BTL (IV SOLUTION) ×1 IMPLANT
PACK PERI GYN (CUSTOM PROCEDURE TRAY) ×1 IMPLANT
PAD ARMBOARD POSITIONER FOAM (MISCELLANEOUS) ×1 IMPLANT
PAD TELFA 3X4 1S STER (GAUZE/BANDAGES/DRESSINGS) ×1 IMPLANT
POSITIONER HEAD 8X9X4 ADT (SOFTGOODS) ×1 IMPLANT
SEAL ROD LENS SCOPE MYOSURE (ABLATOR) ×1 IMPLANT
SET BASIN LINEN APH (SET/KITS/TRAYS/PACK) ×1 IMPLANT
SOL .9 NS 3000ML IRR UROMATIC (IV SOLUTION) ×1 IMPLANT
SOL PREP POV-IOD 4OZ 10% (MISCELLANEOUS) ×1 IMPLANT
SYR 30ML LL (SYRINGE) ×1 IMPLANT
SYR CONTROL 10ML LL (SYRINGE) ×1 IMPLANT
TOWEL OR 17X26 4PK STRL BLUE (TOWEL DISPOSABLE) ×1 IMPLANT
UNDERPAD 30X36 HEAVY ABSORB (UNDERPADS AND DIAPERS) ×1 IMPLANT

## 2023-09-22 NOTE — Anesthesia Procedure Notes (Signed)
 Procedure Name: LMA Insertion Date/Time: 09/22/2023 10:46 AM  Performed by: Alex Hylan, CRNAPre-anesthesia Checklist: Emergency Drugs available, Patient identified, Suction available and Patient being monitored Patient Re-evaluated:Patient Re-evaluated prior to induction Oxygen Delivery Method: Circle system utilized Preoxygenation: Pre-oxygenation with 100% oxygen Induction Type: IV induction LMA: LMA inserted LMA Size: 4.0 Number of attempts: 1 Placement Confirmation: positive ETCO2 and breath sounds checked- equal and bilateral Tube secured with: Tape Dental Injury: Teeth and Oropharynx as per pre-operative assessment

## 2023-09-22 NOTE — Anesthesia Preprocedure Evaluation (Signed)
 Anesthesia Evaluation  Patient identified by MRN, date of birth, ID band Patient awake    Reviewed: Allergy & Precautions, H&P , NPO status , Patient's Chart, lab work & pertinent test results, reviewed documented beta blocker date and time   Airway Mallampati: II  TM Distance: >3 FB Neck ROM: full    Dental no notable dental hx.    Pulmonary neg pulmonary ROS   Pulmonary exam normal breath sounds clear to auscultation       Cardiovascular Exercise Tolerance: Good hypertension,  Rhythm:regular Rate:Normal     Neuro/Psych   Anxiety     negative neurological ROS  negative psych ROS   GI/Hepatic negative GI ROS, Neg liver ROS,,,  Endo/Other  negative endocrine ROS    Renal/GU negative Renal ROS  negative genitourinary   Musculoskeletal   Abdominal   Peds  Hematology negative hematology ROS (+)   Anesthesia Other Findings   Reproductive/Obstetrics negative OB ROS                             Anesthesia Physical Anesthesia Plan  ASA: 2  Anesthesia Plan: General and General LMA   Post-op Pain Management:    Induction:   PONV Risk Score and Plan: Ondansetron  Airway Management Planned:   Additional Equipment:   Intra-op Plan:   Post-operative Plan:   Informed Consent: I have reviewed the patients History and Physical, chart, labs and discussed the procedure including the risks, benefits and alternatives for the proposed anesthesia with the patient or authorized representative who has indicated his/her understanding and acceptance.     Dental Advisory Given  Plan Discussed with: CRNA  Anesthesia Plan Comments:        Anesthesia Quick Evaluation

## 2023-09-22 NOTE — Op Note (Signed)
 Operative Report  PreOp: Endometrial polyp PostOp: same Procedure:  Hysteroscopy, Dilation and Curettage, Polypectomy Surgeon: Dr. Keene Pastures Anesthesia: General Complications:none EBL: Minimal IVF:500 Discrepancy: 115cc  Findings: 6cm uterus with proliferative endometrium, ostia visualized with some difficulty due to left/midline intra-endometrial adhesion.  Two small polyps noted. Specimens: Endometrial polyp and curettings  Procedure: The patient was taken to the operating room where she underwent general anesthesia without difficulty. The patient was placed in a low lithotomy position using Allen stirrups. She was then prepped and draped in the normal sterile fashion. Sterile speculum was placed.  A single tooth tenaculum was placed on the anterior lip of the cervix. Cervical block was completed using 0.5% lidocaine  with epinephrine -20cc.  The uterus was then sounded to 6cm. The endocervical canal was then serially dilated to 21French using Hank dilators to accommodate the hysteroscopic apparatus.  The hysteroscope was inserted and findings were visualized as noted above.  The Myosure was inserted and resection of the polyps as well as endometrial sampling was completed.  The hysteroscope was removed and sharp curettage was performed. The tissue was sent to pathology.   The hysteroscopic apparatus was then reinserted, no endometrial perforation was seen.  All instrument were then removed. Hemostasis was observed at the cervical site. The patient was repositioned to the supine position. The patient tolerated the procedure without any complications and taken to recovery in stable condition.   Addilee Neu, DO Attending Obstetrician & Gynecologist, [facility] for [employer], [facility]

## 2023-09-22 NOTE — Transfer of Care (Signed)
 Immediate Anesthesia Transfer of Care Note  Patient: Samantha [name]  Procedure(s) Performed: DILATATION AND CURETTAGE /HYSTEROSCOPY (Vagina ) POLYPECTOMY, CERVIX (Vagina )  Patient Location: PACU  Anesthesia Type:General  Level of Consciousness: awake  Airway & Oxygen Therapy: Patient Spontanous Breathing and Patient connected to nasal cannula oxygen  Post-op Assessment: Report given to RN and Post -op Vital signs reviewed and stable  Post vital signs: Reviewed and stable  Last Vitals:  Vitals Value Taken Time  BP 110/57   Temp 97.7   Pulse 72 09/22/23 1117  Resp 13 09/22/23 1117  SpO2 98 % 09/22/23 1117  Vitals shown include unfiled device data.  Last Pain:  Vitals:   09/22/23 0934  PainSc: 0-No pain         Complications: No notable events documented.

## 2023-09-22 NOTE — H&P (Signed)
 [facility] Attending History and Physical  Samantha [name] is a 58 y.o. [phone] at Unknown who presents for scheduled hysteroscopy, D&C, polypectomy  In review, pt noted to have PMB where she had about a week of spotting when she wiped.  Denies further bleeding since that time.  Denies pelvic or abdominal pain.  No other acute gyn concerns.  Pelvic US /SHG completed that showed: Sonohysterogram was preformed by Dr. Judea Riches, with ultrasound guidance and surveillance throughout the procedure.The saline outlined a 1.8 x .7 x 1.5 cm echogenic polyp within the endometrial cavity. EEC 3.4 mm,heterogeneous anteverted uterus with multiple fibroids,largest fibroids (#1) posterior right 3.5 x 1.6 x 2.6 cm,(#2) 2.5 x 1.6 x 1.7 cm,normal ovaries,no free fluid   Discussed recommendation for removal of polyp and endometrial sampling.  Denies any abnormal vaginal discharge, fevers, chills, sweats, dysuria, nausea, vomiting, other GI or GU symptoms or other general symptoms.  No acute changes since her last visit.  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  No current facility-administered medications on file prior to encounter.   Current Outpatient Medications on File Prior to Encounter  Medication Sig Dispense Refill   Doxylamine Succinate, Sleep, (SLEEP AID PO) Take 1 tablet by mouth at bedtime.     estradiol  (CLIMARA  - DOSED IN MG/24 HR) 0.05 mg/24hr patch Place 0.05 mg onto the skin once a week.     hydrochlorothiazide (HYDRODIURIL) 12.5 MG tablet Take 12.5 mg by mouth daily.     ibuprofen (ADVIL) 800 MG tablet Take 800 mg by mouth 2 (two) times daily as needed for moderate pain (pain score 4-6).     melatonin 5 MG TABS Take 10 mg by mouth at bedtime.     phentermine  (ADIPEX-P ) 37.5 MG tablet TAKE 1 TABLET BY MOUTH ONCE DAILY BEFORE BREAKFAST 30 tablet 0   progesterone  (PROMETRIUM ) 200 MG capsule Take 1 capsule (200 mg total) by mouth daily. Take 1 capsule daily at bedtime 90 capsule 4   topiramate (TOPAMAX) 50 MG tablet Take 50 mg by mouth at bedtime.     estradiol  (CLIMARA ) 0.0375 mg/24hr patch Place 1 patch (0.0375 mg total) onto the skin once a week. 4 patch 6   valACYclovir (VALTREX) 1000 MG tablet Take 1,000 mg by mouth 2 (two) times daily as needed (fever blisters).     No  Known Allergies  Social History:   reports that she has never smoked. She has never used smokeless tobacco. She reports that she does not drink alcohol and does not use drugs. Family History  Problem Relation Age of Onset   Arthritis Mother    Hypertension Mother    Hyperlipidemia Mother    Arthritis Father    Hypertension Father    Hyperlipidemia Father    Cancer Brother    Hypertension Brother    Hypertension Brother     Review of Systems: Pertinent items noted in HPI and remainder of comprehensive ROS otherwise negative.  PHYSICAL EXAM: Blood pressure (!) 133/93, temperature 98.8 F (37.1 C), resp. rate 14, height 5\' 5"  (1.651 m), weight 74.8 kg, last menstrual period 03/20/2016, SpO2 100%. CONSTITUTIONAL: Well-developed,  well-nourished female in no acute distress.  SKIN: Skin is warm and dry. No rash noted. Not diaphoretic. No erythema. No pallor. NEUROLOGIC: Alert and oriented to person, place, and time. Normal reflexes, muscle tone coordination. No cranial nerve deficit noted. PSYCHIATRIC: Normal mood and affect. Normal behavior. Normal judgment and thought content. CARDIOVASCULAR: Normal heart rate noted, regular rhythm RESPIRATORY: Effort and breath sounds normal, no problems with respiration noted ABDOMEN: Soft, nontender, nondistended. PELVIC: deferred MUSCULOSKELETAL: no calf tenderness bilaterally EXT: no edema bilaterally, normal pulses  Labs: Results for orders placed or performed during the hospital encounter of 09/17/23 (from the past 2 weeks)  Basic metabolic panel   Collection Time: 09/17/23  2:23 PM  Result Value Ref Range   Sodium 137 135 - 145 mmol/L   Potassium 3.6 3.5 - 5.1 mmol/L   Chloride 101 98 - 111 mmol/L   CO2 26 22 - 32 mmol/L   Glucose, Bld 106 (H) 70 - 99 mg/dL   BUN 11 6 - 20 mg/dL   Creatinine, Ser 4.09 0.44 - 1.00 mg/dL   Calcium 9.7 8.9 - 81.1 mg/dL   GFR, Estimated >91 >47 mL/min   Anion gap 10 5 - 15  CBC   Collection Time: 09/17/23  2:23 PM  Result Value Ref Range   WBC 9.2 4.0 - 10.5 K/uL   RBC 4.65 3.87 - 5.11 MIL/uL   Hemoglobin 14.3 12.0 - 15.0 g/dL   HCT 82.9 56.2 - 13.0 %   MCV 91.2 80.0 - 100.0 fL   MCH 30.8 26.0 - 34.0 pg   MCHC 33.7 30.0 - 36.0 g/dL   RDW 86.5 78.4 - 69.6 %   Platelets 330 150 - 400 K/uL   nRBC 0.0 0.0 - 0.2 %    Imaging Studies: US  Sonohysterogram Result Date: 09/03/2023 Images from the original result were not included.  ..an [employer] Technical sales engineer) accredited practice Center for [facility] [address] [place] [address] Ordering Provider: Rickayla Wieland, DO                                                                                       SONOHYSTEROGRAM /   GYNECOLOGIC SONOGRAM Samantha [name] is a 58 y.o. [phone] Patient's last menstrual period was 03/20/2016 (approximate). She is here for a pelvic / sonohysterogram for postmenopausal bleeding.  Uterus                      6.1 x 5.1 x 6.4 cm, Total uterine volume 104 WU:[id number] anteverted uterus with multiple fibroids,largest fibroids (#1) posterior right 3.5 x 1.6 x 2.6 cm,(#2) 2.5 x 1.6 x 1.7 cm Endometrium          3.4 mm, symmetrical, 1.8 x .7 x 1.5 cm echogenic polyp within the endometrial cavity Right ovary             2.3 x 1.3 x 1.9 cm, normal Left ovary                1.9 x 1.9 x 1 cm, normal No free fluid Technician Comments: US  TV sonohysterogram: Sonohysterogram was preformed by Dr. Demetrius Mahler, with ultrasound guidance and surveillance throughout the procedure.The saline outlined a 1.8 x .7 x 1.5 cm echogenic polyp within the endometrial cavity. EEC 3.4 mm,heterogeneous anteverted uterus with multiple fibroids,largest fibroids (#1) posterior right 3.5 x 1.6 x 2.6 cm,(#2) 2.5 x 1.6 x 1.7 cm,normal ovaries,no free fluid,no pain during procedure Chaperone:[employer] Cathy Cobbs 09/02/2023 12:06 PM Sonohysterogram note: Pre-operative Diagnosis: postmenopausal bleeding, suspected uterine polyp Post-operative Diagnosis: same Procedure Details  The risks (including infection, bleeding, pain, and uterine perforation) and benefits of the procedure were explained to the patient and Written informed consent was obtained.   The patient was placed in the dorsal lithotomy position.  A sterile speculum inserted in the vagina, and the cervix prepped with betadine .   A single tooth tenaculum was applied to the anterior lip of the cervix for stabilization.   An intrauterine catheter was inserted into the cervix without difficulty.  Speculum was removed.  The vaginal transducer was re-inserted and 20cc of saline solution was instilled under direct real-time observation.  See findings below.  All instruments were  removed.  Pt tolerated procedure without difficulty.  Condition: Stable Complications: None Clinical Impression and recommendations: I have reviewed the sonogram results above, combined with the patient's current clinical course, below are my impressions and any appropriate recommendations for management based on the sonographic findings. Normal uterine size and shape.  Two small fibroids noted- #1) posterior right 3.5 x 1.6 x 2.6 cm,(#2) 2.5 x 1.6 x 1.7 cm Normal endometrium, following infusion 1.8 x .7 x 1.5 cm echogenic polyp within the endometrial cavity Normal ovaries bilaterally Endometrial polyp visualized, otherwise endometrial lining within normal range for postmenopausal female. Jarrette Dehner, DO Attending Obstetrician & Gynecologist, [facility] for [facility], [facility]    US  Transvaginal Non-OB Result Date: 09/03/2023 Images from the original result were not included.  ..an [employer] Technical sales engineer) accredited practice Center for [facility] [address] [place] [address] Ordering Provider: Nica Friske, DO                                                                                       SONOHYSTEROGRAM /  GYNECOLOGIC SONOGRAM Samantha [name] is a 58 y.o. [phone] Patient's last menstrual period was 03/20/2016 (approximate). She is here for a pelvic /  sonohysterogram for postmenopausal bleeding. Uterus                      6.1 x 5.1 x 6.4 cm, Total uterine volume 104 WU:[id number] anteverted uterus with multiple fibroids,largest fibroids (#1) posterior right 3.5 x 1.6 x 2.6 cm,(#2) 2.5 x 1.6 x 1.7 cm Endometrium          3.4 mm, symmetrical, 1.8 x .7 x 1.5 cm echogenic polyp within the endometrial cavity Right ovary             2.3 x 1.3 x 1.9 cm, normal Left ovary                1.9 x 1.9 x 1 cm, normal No free fluid Technician Comments: US  TV sonohysterogram: Sonohysterogram was preformed by Dr. Jonathan Corpus,  with ultrasound guidance and surveillance throughout the procedure.The saline outlined a 1.8 x .7 x 1.5 cm echogenic polyp within the endometrial cavity. EEC 3.4 mm,heterogeneous anteverted uterus with multiple fibroids,largest fibroids (#1) posterior right 3.5 x 1.6 x 2.6 cm,(#2) 2.5 x 1.6 x 1.7 cm,normal ovaries,no free fluid,no pain during procedure Chaperone:[employer] Cathy Cobbs 09/02/2023 12:06 PM Sonohysterogram note: Pre-operative Diagnosis: postmenopausal bleeding, suspected uterine polyp Post-operative Diagnosis: same Procedure Details  The risks (including infection, bleeding, pain, and uterine perforation) and benefits of the procedure were explained to the patient and Written informed consent was obtained.   The patient was placed in the dorsal lithotomy position.  A sterile speculum inserted in the vagina, and the cervix prepped with betadine .   A single tooth tenaculum was applied to the anterior lip of the cervix for stabilization.   An intrauterine catheter was inserted into the cervix without difficulty.  Speculum was removed.  The vaginal transducer was re-inserted and 20cc of saline solution was instilled under direct real-time observation.  See findings below.  All instruments were removed.  Pt tolerated procedure without difficulty.  Condition: Stable Complications: None Clinical Impression and recommendations: I have reviewed the sonogram results above, combined with the patient's current clinical course, below are my impressions and any appropriate recommendations for management based on the sonographic findings. Normal uterine size and shape.  Two small fibroids noted- #1) posterior right 3.5 x 1.6 x 2.6 cm,(#2) 2.5 x 1.6 x 1.7 cm Normal endometrium, following infusion 1.8 x .7 x 1.5 cm echogenic polyp within the endometrial cavity Normal ovaries bilaterally Endometrial polyp visualized, otherwise endometrial lining within normal range for postmenopausal female. Samantha Mancini, DO  Attending Obstetrician & Gynecologist, [facility] for [facility], [facility]     Assessment: Postmenopausal bleeding Endometrial polyp   Plan: Hysteroscopy, D&C, polypectomy -IV Toradol -NPO -LR @ 125cc/hr -SCDs to OR -Risk/benefits and alternatives reviewed with the patient including but not limited to risk of bleeding, infection and injury due to perforation requiring further surgical intervention.  Questions and concerns were addressed and pt desires to proceed  Juleon Narang, DO Attending Obstetrician & Gynecologist, [facility] for [facility], [facility]

## 2023-09-22 NOTE — Discharge Instructions (Addendum)
 HOME INSTRUCTIONS  Please note any unusual or excessive bleeding, pain, swelling. Mild dizziness or drowsiness are normal for about 24 hours after surgery.   Shower when comfortable  Restrictions: No driving for 24 hours or while taking pain medications.  Activity:  No heavy lifting (> 30 lbs), nothing in vagina (no tampons, douching, or intercourse) x 2 weeks; no tub baths for 2 weeks Vaginal spotting is expected but if your bleeding is heavy, period like,  please call the office   Diet:  You may return to your regular diet.  Do not eat large meals.  Eat small frequent meals throughout the day.  Continue to drink a good amount of water at least 6-8 glasses of water per day, hydration is very important for the healing process.  Pain Management: Take over the counter tylenol or ibuprofen as needed for pain.  You can either take one or alternate between the two medications for pain management.  You may also use a heating pack as needed.    Alcohol -- Avoid for 24 hours and while taking pain medications.  Nausea: Take sips of ginger ale or soda  Fever -- Call physician if temperature over 101 degrees  Follow up:  Follow up as needed.  Please call the office at [phone].  If you experience fever (a temperature greater than 100.4), pain unrelieved by pain medication, shortness of breath, swelling of a single leg, or any other symptoms which are concerning to you please the office immediately.

## 2023-09-23 ENCOUNTER — Encounter (HOSPITAL_COMMUNITY): Payer: Self-pay | Admitting: Obstetrics & Gynecology

## 2023-09-23 ENCOUNTER — Ambulatory Visit: Payer: Self-pay | Admitting: Adult Health

## 2023-09-23 ENCOUNTER — Ambulatory Visit: Payer: Self-pay | Admitting: Obstetrics & Gynecology

## 2023-09-23 LAB — SURGICAL PATHOLOGY

## 2023-09-24 ENCOUNTER — Encounter: Payer: Self-pay | Admitting: Obstetrics & Gynecology

## 2023-09-24 NOTE — Telephone Encounter (Signed)
-   Called patient to review - For now plan to continue with Climara  0.0 375 mg patch, advised to continue on this dose for at least 3 to 6 months - If asymptomatic, may consider to decrease at next visit around September - Patient concerned about mood changes and will continue to monitor at this time - Ultimately may plan to wean however we will check in at her next visit - Question and concerns were addressed and patient agreeable with above  Toi Stelly, DO Attending Obstetrician & Gynecologist, [facility] for [facility], [facility]

## 2023-09-24 NOTE — Anesthesia Postprocedure Evaluation (Signed)
 Anesthesia Post Note  Patient: Samantha [name]  Procedure(s) Performed: DILATATION AND CURETTAGE /HYSTEROSCOPY (Vagina ) POLYPECTOMY, CERVIX (Vagina )  Patient location during evaluation: Phase II Anesthesia Type: General Level of consciousness: awake Pain management: pain level controlled Vital Signs Assessment: post-procedure vital signs reviewed and stable Respiratory status: spontaneous breathing and respiratory function stable Cardiovascular status: blood pressure returned to baseline and stable Postop Assessment: no headache and no apparent nausea or vomiting Anesthetic complications: no Comments: Late entry   No notable events documented.   Last Vitals:  Vitals:   09/22/23 1200 09/22/23 1221  BP: 127/74 (!) 153/78  Pulse: 81 83  Resp: 15 14  Temp:  36.5 C  SpO2: 99% 96%    Last Pain:  Vitals:   09/23/23 1531  TempSrc:   PainSc: 0-No pain                 Coretha Dew

## 2023-10-06 ENCOUNTER — Ambulatory Visit: Admitting: Adult Health

## 2023-11-09 ENCOUNTER — Encounter: Payer: Self-pay | Admitting: Obstetrics & Gynecology

## 2023-11-30 ENCOUNTER — Encounter: Payer: Self-pay | Admitting: Obstetrics & Gynecology

## 2023-11-30 ENCOUNTER — Ambulatory Visit: Admitting: Obstetrics & Gynecology

## 2023-11-30 VITALS — BP 130/84 | HR 69 | Ht 65.0 in | Wt 161.0 lb

## 2023-11-30 DIAGNOSIS — Z79890 Hormone replacement therapy (postmenopausal): Secondary | ICD-10-CM

## 2023-11-30 DIAGNOSIS — N950 Postmenopausal bleeding: Secondary | ICD-10-CM

## 2023-11-30 NOTE — Progress Notes (Signed)
   GYN VISIT Patient name: Samantha [name] MRN [id number]  Date of birth: 07-16-1965 Chief Complaint:   Vaginal Bleeding  History of Present Illness:   Samantha [name] is a 58 y.o. [phone] PM female being seen today for the following concerns:  PMB: On July 20 changed her pad then on the 21st- started spotting.  She noted pink spotting when she wiped.  The spotting continued for about a week.  When she started a new patch the bleeding resolved and since has seen no further bleeding   In review s/p hysteroscopy polypectomy 09-22-2023.  Currently on low-dose patch 0.0 375 mg weekly as well as progesterone  daily, which has worked well to control her symptoms.  She otherwise reports no acute GYN concerns   Patient's last menstrual period was 03/20/2016 (approximate).    Review of Systems:   Pertinent items are noted in HPI Denies fever/chills, dizziness, headaches, visual disturbances, fatigue, shortness of breath, chest pain, abdominal pain, vomiting.  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   CERVICAL POLYPECTOMY N/A 09/22/2023   Procedure: POLYPECTOMY, CERVIX;  Surgeon: Marilynn Nest, DO;  Location: [facility];  Service: Gynecology;  Laterality: N/A;   COLONOSCOPY N/A 08/14/2016   Procedure: COLONOSCOPY;  Surgeon: Claudis RAYMOND Rivet, MD;  [facility];  Service: Endoscopy;  Laterality: N/A;  730   HYSTEROSCOPY WITH D & C N/A 09/22/2023   Procedure: DILATATION AND CURETTAGE /HYSTEROSCOPY;  Surgeon: Evie Crumpler, DO;  Location: [facility];  Service: Gynecology;  Laterality: N/A;   veins      Past Medical History:  Diagnosis Date   Anxiety    Anxiety 04/11/2013   Contraception management 04/11/2013   Hypertension    Vitamin D deficiency 05/17/2018   19.5 on 05/14/2018,  Take 5000 IU vitamin D    Reviewed problem list, medications and  allergies. Physical Assessment:   Vitals:   11/30/23 0851  BP: 130/84  Pulse: 69  Weight: 161 lb (73 kg)  Height: 5' 5 (1.651 m)  Body mass index is 26.79 kg/m.       Physical Examination:   General appearance: alert, well appearing, and in no distress  Psych: mood appropriate, normal affect  Skin: warm & dry   Cardiovascular: normal heart rate noted  Respiratory: normal respiratory effort, no distress  Abdomen: soft, non-tender   Pelvic: VULVA: normal appearing vulva with no masses, tenderness or lesions, VAGINA: normal appearing vagina with normal color and discharge, no lesions, CERVIX: normal appearing cervix without discharge or lesions  Extremities: no edema   Chaperone: Alan Fischer    Assessment & Plan:  1) HRT, vaginal spotting - Based on clinical history, suspect light spotting was due to hormonal changes - Additionally recent biopsy as well as polypectomy revealed benign findings - No intervention needed at this time, will continue to monitor - Okay to continue with current regimen - Follow-up in 1 year   No orders of the defined types were placed in this encounter.   Return for June 2026 annual.   Jett Fukuda, DO Attending Obstetrician & Gynecologist, [facility] for [facility], [facility]

## 2023-12-04 ENCOUNTER — Other Ambulatory Visit: Payer: Self-pay | Admitting: Adult Health

## 2023-12-05 ENCOUNTER — Encounter: Payer: Self-pay | Admitting: Obstetrics & Gynecology

## 2023-12-07 ENCOUNTER — Other Ambulatory Visit: Payer: Self-pay | Admitting: Obstetrics & Gynecology

## 2023-12-07 MED ORDER — PROGESTERONE 200 MG PO CAPS: 200.0000 mg | ORAL_CAPSULE | Freq: Every day | ORAL | 4 refills | Status: AC

## 2023-12-07 NOTE — Progress Notes (Signed)
 Refill progesterone   Tomio Kirk, DO Attending Obstetrician & Gynecologist, [facility] for [employer], [facility]

## 2024-01-25 ENCOUNTER — Encounter: Payer: Self-pay | Admitting: Obstetrics & Gynecology

## 2024-01-26 ENCOUNTER — Telehealth: Payer: Self-pay | Admitting: Obstetrics & Gynecology

## 2024-01-26 NOTE — Telephone Encounter (Signed)
 Called patient; left message to call back.

## 2024-01-26 NOTE — Telephone Encounter (Signed)
-   Called patient to review her concerns about some spotting in between changing her patch - Recent hysteroscopy polypectomy completed in June 2025 and would not suspect in short interim for this bleeding to be concerning pathology -Suspect bleeding is likely due to HRT.  Discussed options including weaning off HRT changing estradiol  patch to see if she notes improvement - If this continues or bleeding changes would also consider further evaluation of an ultrasound but in such a short timeframe would not expect to find any abnormality - Questions and concerns were addressed and patient for now will monitor symptoms and will keep us  posted around the bleeding pattern

## 2024-02-09 NOTE — Progress Notes (Signed)
 REFERRING PHYSICIAN:  Suanne Pfeiffer, NP  PROVIDER:  LONNI OZELL PIZZA, MD  MRN: [id number] DOB: 1965-08-04 DATE OF ENCOUNTER: 02/09/2024  Subjective   Chief Complaint: Possible hemorrhoids   History of Present Illness: Samantha [name] is a 58 y.o. female with history of HTN, whom is seen in the office today as a referral by Dr. Suanne for evaluation of hemorrhoids.    Colonoscopy 07/2016 - Dr. Golda -Perianal skin tags - Colon normal with the exception of diverticulosis in the sigmoid - Hemorrhoids-internal and external  She reports history of hemorrhoids dating back to when she had children.  She has had intermittent issues over the years with hemorrhoids.  She describes prolapsed tissue that is always out and occasional bright red blood per rectum.  In July of this year she had significant bleeding and significant pain that went on for a few weeks.  She struggled with constipation for her entire adult life.  She has previously tried taking once daily MiraLAX with no significant improvement she did this for approximately 1 week.  She most recently has been doing Dulcolax suppositories to some success.  She drinks in excess of 64 ounces of water per day and will spend in excess of 60 minutes on the commode with a bowel movement.  Denies any issues with incontinence to gas, liquid, or solid stool.  PMH: HTN  PSH: D&C; denies any prior anorectal surgeries or procedures  FHx: Denies any known family history of colorectal, breast, endometrial or ovarian cancer  Social Hx: Denies use of tobacco/EtOH/illicit drug.  She is here today by herself.  She works as a Research officer, trade union for [employer]   Review of Systems: A complete review of systems was obtained from the patient.  I have reviewed this information and discussed as appropriate with the patient.  See HPI as well for other ROS.  All of the below systems have been reviewed with the  patient and positives are indicated with bold text General: chills, fever or night sweats Eyes: blurry vision or double vision ENT: epistaxis or sore throat Allergy/Immunology: itchy/watery eyes or nasal congestion Hematologic/Lymphatic: bleeding problems, blood clots or swollen lymph nodes Endocrine: temperature intolerance or unexpected weight changes Breast: new or changing breast lumps or nipple discharge Resp: cough, shortness of breath, or wheezing CV: chest pain or dyspnea on exertion GI: as per HPI GU: dysuria, trouble voiding, or hematuria MSK: joint pain or joint stiffness Neuro: TIA or stroke symptoms Derm: pruritus and skin lesion changes Psych: anxiety and depression   Medical History: Past Medical History:  Diagnosis Date  . Hypertension     There is no problem list on file for this patient.   History reviewed. No pertinent surgical history.   No Known Allergies  Current Outpatient Medications on File Prior to Visit  Medication Sig Dispense Refill  . estradioL  (CLIMARA ) 0.0375 mg/24 hr patch Place 1 patch onto the skin every 7 (seven) days    . hydroCHLOROthiazide (HYDRODIURIL) 12.5 MG tablet Take 12.5 mg by mouth once daily    . melatonin 1 mg tablet Take 5 mg by mouth    . phentermine  (ADIPEX-P ) 37.5 mg tablet Take 37.5 mg by mouth once daily    . progesterone  (PROMETRIUM ) 200 MG capsule Take 200 mg by mouth at bedtime    . topiramate (TOPAMAX) 50 MG tablet Take 50 mg by mouth at bedtime    . valACYclovir (VALTREX) 1000 MG tablet      No  current facility-administered medications on file prior to visit.    Family History  Problem Relation Age of Onset  . Hyperlipidemia (Elevated cholesterol) Mother   . High blood pressure (Hypertension) Mother   . High blood pressure (Hypertension) Father   . Hyperlipidemia (Elevated cholesterol) Father   . Coronary Artery Disease (Blocked arteries around heart) Father   . High blood pressure (Hypertension) Brother       Social History   Tobacco Use  Smoking Status Never  Smokeless Tobacco Never     Social History   Socioeconomic History  . Marital status: Married  Tobacco Use  . Smoking status: Never  . Smokeless tobacco: Never  Vaping Use  . Vaping status: Never Used  Substance and Sexual Activity  . Alcohol use: Never  . Drug use: Never   Social Drivers of Corporate Investment Banker Strain: Low Risk  (01/01/2024)   Received from [facility]   Overall Financial Resource Strain (CARDIA)   . How hard is it for you to pay for the very basics like food, housing, medical care, and heating?: Not hard at all  Food Insecurity: No Food Insecurity (01/01/2024)   Received from [facility]   Hunger Vital Sign   . Within the past 12 months, you worried that your food would run out before you got the money to buy more.: Never true   . Within the past 12 months, the food you bought just didn't last and you didn't have money to get more.: Never true  Transportation Needs: No Transportation Needs (01/01/2024)   Received from [facility] - Transportation   . In the past 12 months, has lack of transportation kept you from medical appointments or from getting medications?: No   . In the past 12 months, has lack of transportation kept you from meetings, work, or from getting things needed for daily living?: No  Physical Activity: Sufficiently Active (01/01/2024)   Received from [facility]   Exercise Vital Sign   . On average, how many days per week do you engage in moderate to strenuous exercise (like a brisk walk)?: 5 days   . On average, how many minutes do you engage in exercise at this level?: 90 min  Stress: No Stress Concern Present (01/01/2024)   Received from [facility] - Occupational Stress Questionnaire   . Do you feel stress - tense, restless, nervous, or anxious, or unable to sleep at night because your mind is troubled all the  time - these days?: Not at all  Social Connections: Socially Integrated (01/01/2024)   Received from [facility]   Social Network   . How would you rate your social network (family, work, friends)?: Good participation with social networks  Housing Stability: Unknown (02/09/2024)   Housing Stability Vital Sign   . Homeless in the Last Year: No    Objective:    Vitals:   02/09/24 1026 02/09/24 1029  BP: (!) 150/110   Pulse: 81   SpO2: 99%   Weight: 73.7 kg (162 lb 6.4 oz)   Height: 165.1 cm (5' 5)   PainSc:  0-No pain    Body mass index is 27.02 kg/m.  Constitutional: NAD; conversant Eyes: Moist conjunctiva; anicteric Lungs: Normal respiratory effort CV: RRR; no pitting edema Anorectal: Decompressed external hemorrhoids, 3 column; normal perianal skin.  DRE-normal to increased tone.  No palpable masses. Anoscopy: Circumferential anoscopy demonstrates healthy appearing anoderm.  No  evident fissures.  No granulation tissue or ulceration.  Small internal hemorrhoids Psychiatric: Appropriate affect  Procedure: Anoscopy We spent time reviewing the procedure, material risks (including but not limited to perianal pain, bleeding) benefits and alternatives.  Witnessed, informed consent was obtained.  Narrative:  The lubricated anoscope was gently inserted into the anal canal. Circumferential anoscopy demonstrates findings noted above  A chaperone, Chemira Jones CMA, was present for this encounter and examination   Assessment and Plan:  Diagnoses and all orders for this visit:  BRBPR (bright red blood per rectum)  Hemorrhoids, unspecified hemorrhoid type     Samantha [name] is a very pleasant 58 y.o. female with hx of HTN here for evaluation of mixed internal and external hemorrhoids  - Referral to GI-she would prefer to see the GI group at [facility].  Discussed that if she does not hear back from GI within the next 2 weeks to please contact my office to ensure the referral  has gone through  - We discussed the anatomy and physiology of the GI tract and pathophysiology of hemorrhoids with associated illustrations using her hemorrhoid handbook, Chartered Loss Adjuster. - I recommended she start taking MiraLAX-previously had taken it once daily for about a week but did not notice any change.  We discussed taking 2 doses of MiraLAX daily and continuing this for at least 2 weeks to see how her symptoms are.  Have also recommended she continue to drink at least 6-8, 8 oz glasses of water per day, and work to minimize time on commode 2 to 3 minutes. - We discussed additional treatment options should the above maneuvers not improve symptoms to the degree desired after 2 to 3 months.  Return in about 3 months (around 05/11/2024).  I spent a total of 45 minutes today in both face-to-face and non-face-to-face activities to perform the following: review records, take history, perform exam, counsel the patient on the diagnosis, and document encounter, findings, and plan in the EHR  for this visit on the date of this encounter.  Lonni Pizza, MD [facility]

## 2024-02-15 ENCOUNTER — Encounter (INDEPENDENT_AMBULATORY_CARE_PROVIDER_SITE_OTHER): Payer: Self-pay | Admitting: *Deleted

## 2024-03-08 ENCOUNTER — Encounter (INDEPENDENT_AMBULATORY_CARE_PROVIDER_SITE_OTHER): Payer: Self-pay | Admitting: *Deleted

## 2024-03-08 ENCOUNTER — Other Ambulatory Visit (INDEPENDENT_AMBULATORY_CARE_PROVIDER_SITE_OTHER): Payer: Self-pay | Admitting: *Deleted

## 2024-03-08 ENCOUNTER — Ambulatory Visit (INDEPENDENT_AMBULATORY_CARE_PROVIDER_SITE_OTHER): Admitting: Gastroenterology

## 2024-03-08 ENCOUNTER — Encounter (INDEPENDENT_AMBULATORY_CARE_PROVIDER_SITE_OTHER): Payer: Self-pay | Admitting: Gastroenterology

## 2024-03-08 ENCOUNTER — Telehealth (INDEPENDENT_AMBULATORY_CARE_PROVIDER_SITE_OTHER): Payer: Self-pay | Admitting: *Deleted

## 2024-03-08 ENCOUNTER — Telehealth (INDEPENDENT_AMBULATORY_CARE_PROVIDER_SITE_OTHER): Payer: Self-pay | Admitting: Gastroenterology

## 2024-03-08 VITALS — BP 124/76 | HR 79 | Temp 98.2°F | Ht 65.0 in | Wt 160.3 lb

## 2024-03-08 DIAGNOSIS — K581 Irritable bowel syndrome with constipation: Secondary | ICD-10-CM | POA: Diagnosis not present

## 2024-03-08 DIAGNOSIS — K5904 Chronic idiopathic constipation: Secondary | ICD-10-CM | POA: Insufficient documentation

## 2024-03-08 DIAGNOSIS — K921 Melena: Secondary | ICD-10-CM | POA: Diagnosis not present

## 2024-03-08 DIAGNOSIS — K641 Second degree hemorrhoids: Secondary | ICD-10-CM

## 2024-03-08 MED ORDER — HYDROCORTISONE ACETATE 25 MG RE SUPP: 25.0000 mg | Freq: Two times a day (BID) | RECTAL | 0 refills | Status: AC

## 2024-03-08 MED ORDER — PHENYLEPHRINE-MINERAL OIL-PET 0.25-14-74.9 % RE OINT: 1.0000 | TOPICAL_OINTMENT | Freq: Two times a day (BID) | RECTAL | 0 refills | Status: AC

## 2024-03-08 MED ORDER — LINACLOTIDE 145 MCG PO CAPS: 145.0000 ug | ORAL_CAPSULE | Freq: Every day | ORAL | Status: DC

## 2024-03-08 MED ORDER — PEG 3350-KCL-NA BICARB-NACL 420 G PO SOLR
4000.0000 mL | Freq: Once | ORAL | 0 refills | Status: AC
Start: 2024-03-08 — End: 2024-03-08

## 2024-03-08 NOTE — Addendum Note (Signed)
 Addended by: Reynaldo Rossman on: 03/08/2024 09:48 AM   Modules accepted: Orders

## 2024-03-08 NOTE — Telephone Encounter (Signed)
 PA for colonoscopy via umr Case ID# [id number]

## 2024-03-08 NOTE — Patient Instructions (Signed)
 It was very nice to meet you today, as dicussed with will plan for the following :  1)Ensure adequate fluid intake: Aim for 8 glasses of water daily. Follow a high fiber diet: Include foods such as dates, prunes, pears, and kiwi. Take Miralax twice a day for the first week, then reduce to once daily thereafter.  Linzess    2) Anusol suppository and Preparation H cream for 7 days   3) Colonoscopy

## 2024-03-08 NOTE — Telephone Encounter (Addendum)
 PA approved via UMR Case ID:  [id number] 03/21/2024-06/18/2024

## 2024-03-08 NOTE — Progress Notes (Signed)
 Tobenna Needs Faizan Joni Norrod , M.D. Gastroenterology & Hepatology [facility] Gastroenterology [address] [place], [place] [address] Primary Care Physician: Suanne Pfeiffer, NP [address] [place] [address]  Chief Complaint: Hematochezia  History of Present Illness: Samantha [name] is a 58 y.o. female with HTN  who presents for evaluation of Blood per rectum and hemorrhoids  Patient reports that for past few months she has been noticing fresh blood upon wiping and mixed with stool as well.  Patient reports on Bristol stool scale type I-II bowel movement daily but had to sit on the toilet about for over 30 minutes with some straining.  Since starting MiraLAX twice daily bowel movements have softened but she still continues to see blood upon wiping. The patient denies having any nausea, vomiting, fever, chills, melena, hematemesis, abdominal distention, abdominal pain, diarrhea, jaundice, pruritus or weight loss.  Last ZHI:wnwz  Last Colonoscopy: Colonoscopy 07/2016 - Dr. Golda -Perianal skin tags - Colon normal with the exception of diverticulosis in the sigmoid - Hemorrhoids-internal and external  FHx: neg for any gastrointestinal/liver disease, no malignancies Social: neg smoking, alcohol or illicit drug use Surgical: no abdominal surgeries  Past Medical History: Past Medical History:  Diagnosis Date   Anxiety    Anxiety 04/11/2013   Contraception management 04/11/2013   Hypertension    Vitamin D deficiency 05/17/2018   19.5 on 05/14/2018,  Take 5000 IU vitamin D     Past Surgical History: Past Surgical History:  Procedure Laterality Date   BREAST BIOPSY Right 2022   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   CERVICAL POLYPECTOMY N/A 09/22/2023   Procedure: POLYPECTOMY, CERVIX;  Surgeon: Marilynn Nest, DO;  Location: [facility];  Service: Gynecology;  Laterality: N/A;   COLONOSCOPY N/A 08/14/2016    Procedure: COLONOSCOPY;  Surgeon: Claudis RAYMOND Golda, MD;  Location: [facility];  Service: Endoscopy;  Laterality: N/A;  730   HYSTEROSCOPY WITH D & C N/A 09/22/2023   Procedure: DILATATION AND CURETTAGE /HYSTEROSCOPY;  Surgeon: Ozan, Jennifer, DO;  Location: [facility];  Service: Gynecology;  Laterality: N/A;   veins      Family History: Family History  Problem Relation Age of Onset   Arthritis Mother    Hypertension Mother    Hyperlipidemia Mother    Arthritis Father    Hypertension Father    Hyperlipidemia Father    Cancer Brother    Hypertension Brother    Hypertension Brother     Social History: Social History   Tobacco Use  Smoking Status Never  Smokeless Tobacco Never   Social History   Substance and Sexual Activity  Alcohol Use No   Alcohol/week: 0.0 standard drinks of alcohol   Social History   Substance and Sexual Activity  Drug Use No    Allergies: No Known Allergies  Medications: Current Outpatient Medications  Medication Sig Dispense Refill   Doxylamine Succinate, Sleep, (SLEEP AID PO) Take 1 tablet by mouth at bedtime.     estradiol  (CLIMARA ) 0.0375 mg/24hr patch Place 1 patch (0.0375 mg total) onto the skin once a week. 4 patch 6   hydrochlorothiazide (HYDRODIURIL) 12.5 MG tablet Take 12.5 mg by mouth daily.     ibuprofen (ADVIL) 800 MG tablet Take 800 mg by mouth 2 (two) times daily as needed for moderate pain (pain score 4-6).     melatonin 5 MG TABS Take 10 mg by mouth at bedtime.  phentermine  (ADIPEX-P ) 37.5 MG tablet TAKE 1 TABLET BY MOUTH ONCE DAILY BEFORE BREAKFAST 30 tablet 0   progesterone  (PROMETRIUM ) 200 MG capsule Take 1 capsule (200 mg total) by mouth at bedtime. 90 capsule 4   topiramate (TOPAMAX) 50 MG tablet Take 50 mg by mouth at bedtime.     valACYclovir (VALTREX) 1000 MG tablet Take 1,000 mg by mouth 2 (two) times daily as needed (fever blisters).     No current facility-administered medications for this visit.    Review of  Systems: GENERAL: negative for malaise, night sweats HEENT: No changes in hearing or vision, no nose bleeds or other nasal problems. NECK: Negative for lumps, goiter, pain and significant neck swelling RESPIRATORY: Negative for cough, wheezing CARDIOVASCULAR: Negative for chest pain, leg swelling, palpitations, orthopnea GI: SEE HPI MUSCULOSKELETAL: Negative for joint pain or swelling, back pain, and muscle pain. SKIN: Negative for lesions, rash HEMATOLOGY Negative for prolonged bleeding, bruising easily, and swollen nodes. ENDOCRINE: Negative for cold or heat intolerance, polyuria, polydipsia and goiter. NEURO: negative for tremor, gait imbalance, syncope and seizures. The remainder of the review of systems is noncontributory.   Physical Exam: BP 124/76   Pulse 79   Temp 98.2 F (36.8 C)   Ht 5' 5 (1.651 m)   Wt 160 lb 4.8 oz (72.7 kg)   LMP 03/20/2016 (Approximate)   BMI 26.68 kg/m  GENERAL: The patient is AO x3, in no acute distress. HEENT: Head is normocephalic and atraumatic. EOMI are intact. Mouth is well hydrated and without lesions. NECK: Supple. No masses LUNGS: Clear to auscultation. No presence of rhonchi/wheezing/rales. Adequate chest expansion HEART: RRR, normal s1 and s2. ABDOMEN: Soft, nontender, no guarding, no peritoneal signs, and nondistended. BS +. No masses.   Imaging/Labs: as above     Latest Ref Rng & Units 09/17/2023    2:23 PM  CBC  WBC 4.0 - 10.5 K/uL 9.2   Hemoglobin 12.0 - 15.0 g/dL 85.6   Hematocrit 63.9 - 46.0 % 42.4   Platelets 150 - 400 K/uL 330    No results found for: IRON, TIBC, FERRITIN  I personally reviewed and interpreted the available labs, imaging and endoscopic files.  Impression and Plan:  Samantha [name] is a 58 y.o. female with HTN  who presents for evaluation of Blood per rectum and hemorrhoids  #Hematochezia #Constipation  Patient showed pictures of her hemorrhoids today appears to be grade 2-3.  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  On Bristol stool scale type I and II bowel movements  MiraLAX twice daily has seemed to improve consistency of the stool but has occasional straining as well  Hematochezia is always considered an alarm symptom and without colonoscopy cannot rule out underlyi polyps or inflammatory bowel  Will start with getting her bowel movements more regular  Ensure adequate fluid intake: Aim for 8 glasses of water daily. Follow a high fiber diet: Include foods such as dates, prunes, pears, and kiwi. Take Miralax twice a day for the first week, then reduce to once daily thereafter.  Linzess 145mcg samples given , if patient improves on it we will prescribe it  Medical management for hemorrhoids ;Anusol suppository and Preparation H cream for 7 days   Schedule ileocolonoscopy  I thoroughly discussed with the patient the procedure,  including the risks involved. Patient understands what the procedure involves including the benefits and any risks. Patient understands alternatives to the proposed procedure. Risks including (but not limited to) bleeding, tearing of the lining (perforation), rupture of adjacent organs, problems with heart and lung function, infection, and medication reactions. A small percentage of complications may require surgery, hospitalization, repeat endoscopic procedure, and/or transfusion.  Patient understood and agreed.   All questions were answered.      Samantha [name] Faizan England Greb, MD Gastroenterology and Hepatology [facility]   This chart has been completed using [facility] Dictation software, and while attempts have been made to ensure accuracy , certain words and phrases may  not be transcribed as intended

## 2024-03-08 NOTE — Telephone Encounter (Signed)
 Medication Samples have been provided to the patient.  Drug name: Linzess     Strength: 145 mcg       Qty: 3 boxes   LOT: [id number]  Exp.Date: 03/26  Dosing instructions: take one capsule po daily  The patient has been instructed regarding the correct time, dose, and frequency of taking this medication, including desired effects and most common side effects.   Samantha [name] 9:47 AM 03/08/2024

## 2024-03-25 ENCOUNTER — Other Ambulatory Visit: Payer: Self-pay | Admitting: Obstetrics & Gynecology

## 2024-03-25 ENCOUNTER — Other Ambulatory Visit: Payer: Self-pay | Admitting: *Deleted

## 2024-03-25 DIAGNOSIS — Z79890 Hormone replacement therapy (postmenopausal): Secondary | ICD-10-CM

## 2024-03-25 MED ORDER — ESTRADIOL 0.0375 MG/24HR TD PTWK: 0.0375 mg | MEDICATED_PATCH | TRANSDERMAL | 6 refills | Status: AC

## 2024-03-25 NOTE — Progress Notes (Signed)
 Refill climara   Maitland Lesiak, DO Attending Obstetrician & Gynecologist, [facility] for [employer], [facility]

## 2024-04-03 ENCOUNTER — Encounter (INDEPENDENT_AMBULATORY_CARE_PROVIDER_SITE_OTHER): Payer: Self-pay | Admitting: Gastroenterology

## 2024-04-03 DIAGNOSIS — K581 Irritable bowel syndrome with constipation: Secondary | ICD-10-CM

## 2024-04-04 MED ORDER — LINACLOTIDE 72 MCG PO CAPS: 72.0000 ug | ORAL_CAPSULE | Freq: Every day | ORAL | 1 refills | Status: AC

## 2024-04-15 ENCOUNTER — Encounter (HOSPITAL_COMMUNITY)
Admission: RE | Admit: 2024-04-15 | Discharge: 2024-04-15 | Disposition: A | Discharge status: Home / Self Care | Source: Ambulatory Visit | Attending: Gastroenterology | Admitting: Gastroenterology

## 2024-04-15 ENCOUNTER — Encounter (HOSPITAL_COMMUNITY): Payer: Self-pay

## 2024-04-15 ENCOUNTER — Other Ambulatory Visit: Payer: Self-pay

## 2024-04-18 ENCOUNTER — Encounter (INDEPENDENT_AMBULATORY_CARE_PROVIDER_SITE_OTHER): Payer: Self-pay | Admitting: Gastroenterology

## 2024-04-19 ENCOUNTER — Encounter (INDEPENDENT_AMBULATORY_CARE_PROVIDER_SITE_OTHER): Payer: Self-pay | Admitting: Gastroenterology

## 2024-04-19 ENCOUNTER — Encounter (HOSPITAL_COMMUNITY): Payer: Self-pay | Admitting: Gastroenterology

## 2024-04-19 ENCOUNTER — Ambulatory Visit (HOSPITAL_COMMUNITY)

## 2024-04-19 ENCOUNTER — Ambulatory Visit (HOSPITAL_COMMUNITY)
Admission: RE | Admit: 2024-04-19 | Discharge: 2024-04-19 | Disposition: A | Discharge status: Home / Self Care | Attending: Gastroenterology | Admitting: Gastroenterology

## 2024-04-19 ENCOUNTER — Encounter (HOSPITAL_COMMUNITY)
Admission: RE | Disposition: A | Discharge status: Home / Self Care | Payer: Self-pay | Source: Home / Self Care | Attending: Gastroenterology

## 2024-04-19 DIAGNOSIS — K644 Residual hemorrhoidal skin tags: Secondary | ICD-10-CM | POA: Diagnosis not present

## 2024-04-19 DIAGNOSIS — K921 Melena: Secondary | ICD-10-CM | POA: Insufficient documentation

## 2024-04-19 DIAGNOSIS — D122 Benign neoplasm of ascending colon: Secondary | ICD-10-CM

## 2024-04-19 DIAGNOSIS — D123 Benign neoplasm of transverse colon: Secondary | ICD-10-CM | POA: Diagnosis not present

## 2024-04-19 DIAGNOSIS — K5730 Diverticulosis of large intestine without perforation or abscess without bleeding: Secondary | ICD-10-CM | POA: Insufficient documentation

## 2024-04-19 DIAGNOSIS — I10 Essential (primary) hypertension: Secondary | ICD-10-CM | POA: Diagnosis not present

## 2024-04-19 DIAGNOSIS — D125 Benign neoplasm of sigmoid colon: Secondary | ICD-10-CM | POA: Diagnosis not present

## 2024-04-19 DIAGNOSIS — K648 Other hemorrhoids: Secondary | ICD-10-CM | POA: Diagnosis not present

## 2024-04-19 DIAGNOSIS — Z79899 Other long term (current) drug therapy: Secondary | ICD-10-CM | POA: Insufficient documentation

## 2024-04-19 DIAGNOSIS — Z79890 Hormone replacement therapy (postmenopausal): Secondary | ICD-10-CM | POA: Diagnosis not present

## 2024-04-19 DIAGNOSIS — F419 Anxiety disorder, unspecified: Secondary | ICD-10-CM | POA: Diagnosis not present

## 2024-04-19 HISTORY — PX: POLYPECTOMY: SHX149

## 2024-04-19 HISTORY — PX: COLONOSCOPY: SHX5424

## 2024-04-19 LAB — HM COLONOSCOPY

## 2024-04-19 SURGERY — COLONOSCOPY
Anesthesia: General

## 2024-04-19 MED ORDER — LIDOCAINE 2% (20 MG/ML) 5 ML SYRINGE
INTRAMUSCULAR | Status: DC | PRN
  Administered 2024-04-19: 60 mg via INTRAVENOUS

## 2024-04-19 MED ORDER — LACTATED RINGERS IV SOLN: INTRAVENOUS | Status: DC

## 2024-04-19 MED ORDER — PROPOFOL 500 MG/50ML IV EMUL
INTRAVENOUS | Status: DC | PRN
  Administered 2024-04-19: 125 ug/kg/min via INTRAVENOUS
  Administered 2024-04-19: 40 mg via INTRAVENOUS
  Administered 2024-04-19: 30 mg via INTRAVENOUS
  Administered 2024-04-19: 70 mg via INTRAVENOUS
  Administered 2024-04-19: 30 mg via INTRAVENOUS

## 2024-04-19 MED ORDER — ONDANSETRON HCL 4 MG/2ML IJ SOLN
4.0000 mg | Freq: Once | INTRAMUSCULAR | Status: AC
  Administered 2024-04-19: 4 mg via INTRAVENOUS

## 2024-04-19 MED ORDER — ONDANSETRON HCL 4 MG/2ML IJ SOLN
INTRAMUSCULAR | Status: AC
  Filled 2024-04-19: qty 2

## 2024-04-19 NOTE — H&P (Signed)
 Primary Care Physician:  Suanne Pfeiffer, NP Primary Gastroenterologist:  Dr. Cinderella  Pre-Procedure History & Physical: HPI:  Samantha [name] is a 58 y.o. female with HTN  who presents for evaluation of Blood per rectum and hemorrhoids   Patient reports that for past few months she has been noticing fresh blood upon wiping and mixed with stool as well.  Patient reports on Bristol stool scale type I-II bowel movement daily but had to sit on the toilet about for over 30 minutes with some straining.  Since starting MiraLAX twice daily bowel movements have softened but she still continues to see blood upon wiping. The patient denies having any nausea, vomiting, fever, chills, melena, hematemesis, abdominal distention, abdominal pain, diarrhea, jaundice, pruritus or weight loss.   Last ZHI:wnwz   Last Colonoscopy: Colonoscopy 07/2016 - Dr. 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 Authorizing Provider  Doxylamine Succinate, Sleep, (SLEEP AID PO) Take 1 tablet by mouth at bedtime.   Yes [provider]  estradiol  (CLIMARA ) 0.0375 mg/24hr patch Place 1 patch (0.0375 mg total) onto the skin once a week. 03/25/24  Yes Ozan, Jennifer, DO  hydrochlorothiazide (HYDRODIURIL) 12.5 MG tablet Take 12.5 mg by mouth daily. 08/16/21  Yes [provider]  hydrocortisone  (ANUSOL -HC) 25 MG suppository Place 1 suppository (25 mg total) rectally 2 (two) times daily. 03/08/24  Yes Kiante Petrovich, Deatrice FALCON, MD  linaclotide  (LINZESS ) 72 MCG capsule Take 1 capsule (72 mcg total) by mouth daily. 04/04/24 10/01/24 Yes Crescent Gotham, Deatrice FALCON, MD  melatonin 5 MG TABS Take 10 mg by mouth at bedtime.   Yes [provider]  progesterone  (PROMETRIUM ) 200 MG capsule Take 1 capsule (200 mg total) by mouth at bedtime. 12/07/23  Yes Ozan, Jennifer, DO  topiramate (TOPAMAX) 50 MG tablet Take 50 mg by mouth at bedtime. 08/08/21  Yes [provider]  valACYclovir (VALTREX) 1000 MG tablet Take 1,000 mg by mouth 2 (two) times daily as needed (fever blisters). 12/29/22  Yes [provider]  ibuprofen (ADVIL) 800 MG tablet Take 800 mg by mouth 2 (two) times daily as needed for moderate pain (pain score 4-6). 04/21/19   [provider]  phentermine  (ADIPEX-P ) 37.5 MG tablet TAKE 1 TABLET BY MOUTH ONCE DAILY BEFORE BREAKFAST 09/20/19  Signa Delon LABOR, NP    Allergies as of 03/08/2024   (No Known Allergies)    Family History  Problem Relation Age of Onset   Arthritis Mother    Hypertension Mother    Hyperlipidemia Mother    Arthritis Father    Hypertension Father    Hyperlipidemia Father    Cancer Brother    Hypertension Brother    Hypertension Brother     Social History   Socioeconomic History    Marital status: Married    Spouse name: Not on file   Number of children: Not on file   Years of education: Not on file   Highest education level: Not on file  Occupational History   Not on file  Tobacco Use   Smoking status: Never   Smokeless tobacco: Never  Vaping Use   Vaping status: Never Used  Substance and Sexual Activity   Alcohol use: No    Alcohol/week: 0.0 standard drinks of alcohol   Drug use: No   Sexual activity: Yes    Birth control/protection: Post-menopausal  Other Topics Concern   Not on file  Social History Narrative   Not on file   Social Drivers of Health   Tobacco Use: Low Risk (04/19/2024)   Patient History    Smoking Tobacco Use: Never    Smokeless Tobacco Use: Never    Passive Exposure: Not on file  Financial Resource Strain: Low Risk (01/01/2024)   Received from [facility]   Overall Financial Resource Strain (CARDIA)    How hard is it for you to pay for the very basics like food, housing, medical care, and heating?: Not hard at all  Food Insecurity: No Food Insecurity (01/01/2024)   Received from [facility]   Epic    Within the past 12 months, you worried that your food would run out before you got the money to buy more.: Never true    Within the past 12 months, the food you bought just didn't last and you didn't have money to get more.: Never true  Transportation Needs: No Transportation Needs (01/01/2024)   Received from [facility]    In the past 12 months, has lack of transportation kept you from medical appointments or from getting medications?: No    In the past 12 months, has lack of transportation kept you from meetings, work, or from getting things needed for daily living?: No  Physical Activity: Sufficiently Active (01/01/2024)   Received from [facility]   Exercise Vital Sign    On average, how many days per week do you engage in moderate to strenuous exercise (like a brisk walk)?: 5 days    On average, how many minutes  do you engage in exercise at this level?: 90 min  Stress: No Stress Concern Present (01/01/2024)   Received from [facility] - Occupational Stress Questionnaire    Do you feel stress - tense, restless, nervous, or anxious, or unable to sleep at night because your mind is troubled all the time - these days?: Not at all  Social Connections: Socially Integrated (01/01/2024)   Received from [facility]   Social Network    How would you rate your social network (family, work, friends)?: Good participation with social networks  Intimate Partner Violence: Not At Risk (01/01/2024)   Received from [facility]   HITS    Over the last 12 months how often did your partner physically  hurt you?: Never    Over the last 12 months how often did your partner insult you or talk down to you?: Never    Over the last 12 months how often did your partner threaten you with physical harm?: Never    Over the last 12 months how often did your partner scream or curse at you?: Never  Depression (PHQ2-9): Low Risk (09/16/2022)   Depression (PHQ2-9)    PHQ-2 Score: 0  Alcohol Screen: Low Risk (09/16/2022)   Alcohol Screen    Last Alcohol Screening Score (AUDIT): 0  Housing: Unknown (02/09/2024)   Received from [facility]   Epic    Unable to Pay for Housing in the Last Year: Not on file    Number of Times Moved in the Last Year: Not on file    At any time in the past 12 months, were you homeless or living in a shelter (including now)?: No  Utilities: Not At Risk (01/01/2024)   Received from [facility]    In the past 12 months has the electric, gas, oil, or water company threatened to shut off services in your home?: No  Health Literacy: Not on file    Review of Systems: See HPI, otherwise negative ROS  Physical Exam: Vital signs in last 24 hours: Temp:  [97.8 F (36.6 C)] 97.8 F (36.6 C) (12/30 0638) Pulse Rate:  [79] 79 (12/30  0638) Resp:  [20] 20 (12/30 0638) BP: (150)/(84) 150/84 (12/30 0638) SpO2:  [100 %] 100 % (12/30 9361) Weight:  [73.9 kg] 73.9 kg (12/30 9361)   General:   Alert,  Well-developed, well-nourished, pleasant and cooperative in NAD Head:  Normocephalic and atraumatic. Eyes:  Sclera clear, no icterus.   Conjunctiva pink. Ears:  Normal auditory acuity. Nose:  No deformity, discharge,  or lesions. Msk:  Symmetrical without gross deformities. Normal posture. Extremities:  Without clubbing or edema. Neurologic:  Alert and  oriented x4;  grossly normal neurologically. Skin:  Intact without significant lesions or rashes. Psych:  Alert and cooperative. Normal mood and affect.  Samantha [name] is a 58 y.o. female with HTN  who presents for evaluation of Blood per rectum and hemorrhoids  Proceed with Ileo-Colonoscopy   The risks of the procedure including infection, bleed, or perforation as well as benefits, limitations, alternatives and imponderables have been reviewed with the patient. Questions have been answered. All parties agreeable.

## 2024-04-19 NOTE — Transfer of Care (Addendum)
 Immediate Anesthesia Transfer of Care Note  Patient: Samantha [name]  Procedure(s) Performed: COLONOSCOPY POLYPECTOMY, INTESTINE  Patient Location: Short Stay  Anesthesia Type:MAC  Level of Consciousness: drowsy and patient cooperative  Airway & Oxygen Therapy: Patient Spontanous Breathing and Patient connected to nasal cannula oxygen  Post-op Assessment: Report given to RN and Post -op Vital signs reviewed and stable  Post vital signs: Reviewed and stable  Last Vitals:  Vitals Value Taken Time  BP 118/61 04/19/24   0823  Temp 36.5 04/19/24   0823  Pulse 74 04/19/24   0823  Resp 20 04/19/24   0823  SpO2 100% 04/19/24   0823    Last Pain:  Vitals:   04/19/24 0741  TempSrc:   PainSc: 0-No pain      Patients Stated Pain Goal: 6 (04/19/24 9361)  Complications: No notable events documented.

## 2024-04-19 NOTE — Discharge Instructions (Signed)
  Discharge instructions Please read the instructions outlined below and refer to this sheet in the next few weeks. These discharge instructions provide you with general information on caring for yourself after you leave the hospital. Your doctor may also give you specific instructions. While your treatment has been planned according to the most current medical practices available, unavoidable complications occasionally occur. If you have any problems or questions after discharge, please call your doctor. ACTIVITY You may resume your regular activity but move at a slower pace for the next 24 hours.  Take frequent rest periods for the next 24 hours.  Walking will help expel (get rid of) the air and reduce the bloated feeling in your abdomen.  No driving for 24 hours (because of the anesthesia (medicine) used during the test).  You may shower.  Do not sign any important legal documents or operate any machinery for 24 hours (because of the anesthesia used during the test).  NUTRITION Drink plenty of fluids.  You may resume your normal diet.  Begin with a light meal and progress to your normal diet.  Avoid alcoholic beverages for 24 hours or as instructed by your caregiver.  MEDICATIONS You may resume your normal medications unless your caregiver tells you otherwise.  WHAT YOU CAN EXPECT TODAY You may experience abdominal discomfort such as a feeling of fullness or "gas" pains.  FOLLOW-UP Your doctor will discuss the results of your test with you.  SEEK IMMEDIATE MEDICAL ATTENTION IF ANY OF THE FOLLOWING OCCUR: Excessive nausea (feeling sick to your stomach) and/or vomiting.  Severe abdominal pain and distention (swelling).  Trouble swallowing.  Temperature over 101 F (37.8 C).  Rectal bleeding or vomiting of blood.     Ensure adequate fluid intake: Aim for 8 glasses of water daily. Follow a high fiber diet: Include foods such as dates, prunes, pears, and kiwi. Use Metamucil daily.  I  hope you have a great rest of your week!   Vista Lawman , M.D.. Gastroenterology and Hepatology [facility]

## 2024-04-19 NOTE — Op Note (Signed)
 [facility] Patient Name: Samantha [name] Procedure Date: 04/19/2024 7:04 AM MRN: [id number] Date of Birth: 10-Jan-1966 Attending MD: Deatrice Dine , MD, [id number] CSN: [id number] Age: 58 Admit Type: Outpatient Procedure:                Colonoscopy Indications:              Hematochezia Providers:                Deatrice Dine, MD, Madelin Hunter, RN, Chad Wilson,                            Technician Referring MD:              Medicines:                Monitored Anesthesia Care Complications:            No immediate complications. Estimated Blood Loss:     Estimated blood loss was minimal. Procedure:                Pre-Anesthesia Assessment:                           - Prior to the procedure, a History and Physical                            was performed, and patient medications and                            allergies were reviewed. The patient's tolerance of                            previous anesthesia was also reviewed. The risks                            and benefits of the procedure and the sedation                            options and risks were discussed with the patient.                            All questions were answered, and informed consent                            was obtained. Prior Anticoagulants: The patient has                            taken no anticoagulant or antiplatelet agents                            except for NSAID medication. ASA Grade Assessment:                            II - A patient with mild systemic disease. After                            reviewing  the risks and benefits, the patient was                            deemed in satisfactory condition to undergo the                            procedure.                           After obtaining informed consent, the colonoscope                            was passed under direct vision. Throughout the                            procedure, the patient's blood pressure, pulse, and                             oxygen saturations were monitored continuously. The                            [id number] ([id number]) Colon was introduced through                            the anus and advanced to the the cecum, identified                            by appendiceal orifice and ileocecal valve. The                            colonoscopy was performed without difficulty. The                            patient tolerated the procedure well. The quality                            of the bowel preparation was evaluated using the                            BBPS [facility] Bowel Preparation Scale) with scores                            of: Right Colon = 3, Transverse Colon = 3 and Left                            Colon = 3 (entire mucosa seen well with no residual                            staining, small fragments of stool or opaque                            liquid). The total BBPS score equals 9. The  ileocecal valve, appendiceal orifice, and rectum                            were photographed. Scope In: 7:47:46 AM Scope Out: 8:19:39 AM Scope Withdrawal Time: 0 hours 27 minutes 45 seconds  Total Procedure Duration: 0 hours 31 minutes 53 seconds  Findings:      Hemorrhoids were found on perianal exam.      Three sessile polyps were found in the ascending colon. The polyps were       3 to 5 mm in size. These polyps were removed with a cold snare.       Resection and retrieval were complete.      A 6 mm polyp was found in the transverse colon. The polyp was sessile.       The polyp was removed with a cold snare. Resection and retrieval were       complete.      A 3 mm polyp was found in the sigmoid colon. The polyp was sessile. The       polyp was removed with a cold snare. Resection and retrieval were       complete.      Scattered small-mouthed diverticula were found in the entire colon.      Non-bleeding external and internal hemorrhoids were found during        retroflexion. The hemorrhoids were medium-sized.      -Unable to intubate terminal ileum due to tight angulation Impression:               - Hemorrhoids found on perianal exam.                           - Three 3 to 5 mm polyps in the ascending colon,                            removed with a cold snare. Resected and retrieved.                           - One 6 mm polyp in the transverse colon, removed                            with a cold snare. Resected and retrieved.                           - One 3 mm polyp in the sigmoid colon, removed with                            a cold snare. Resected and retrieved.                           - Diverticulosis in the entire examined colon.                           - Non-bleeding external and internal hemorrhoids. Moderate Sedation:      Per Anesthesia Care Recommendation:           - Patient has a contact number available for  emergencies. The signs and symptoms of potential                            delayed complications were discussed with the                            patient. Return to normal activities tomorrow.                            Written discharge instructions were provided to the                            patient.                           - Resume previous diet.                           - Continue present medications.                           - Await pathology results.                           - Repeat colonoscopy in 3 years for surveillance                            based on pathology results.                           - Return to GI clinic as previously scheduled. Procedure Code(s):        --- Professional ---                           [phone], Colonoscopy, flexible; with removal of                            tumor(s), polyp(s), or other lesion(s) by snare                            technique Diagnosis Code(s):        --- Professional ---                           D12.2, Benign neoplasm of  ascending colon                           D12.3, Benign neoplasm of transverse colon (hepatic                            flexure or splenic flexure)                           D12.5, Benign neoplasm of sigmoid colon                           K64.8, Other hemorrhoids  K92.1, Melena (includes Hematochezia)                           K57.30, Diverticulosis of large intestine without                            perforation or abscess without bleeding CPT copyright 2022 American Medical Association. All rights reserved. The codes documented in this report are preliminary and upon coder review may  be revised to meet current compliance requirements. Deatrice Dine, MD Deatrice Dine, MD 04/19/2024 8:34:16 AM This report has been signed electronically. Number of Addenda: 0

## 2024-04-19 NOTE — Anesthesia Preprocedure Evaluation (Addendum)
"                                    Anesthesia Evaluation  Patient identified by MRN, date of birth, ID band Patient awake  General Assessment Comment:Patient currently nauseous; zofran  given preop  Reviewed: Allergy & Precautions, H&P , NPO status , Patient's Chart, lab work & pertinent test results  Airway Mallampati: II  TM Distance: >3 FB Neck ROM: Full    Dental no notable dental hx.    Pulmonary neg pulmonary ROS   Pulmonary exam normal breath sounds clear to auscultation       Cardiovascular hypertension, Normal cardiovascular exam Rhythm:Regular Rate:Normal     Neuro/Psych   Anxiety     negative neurological ROS  negative psych ROS   GI/Hepatic negative GI ROS, Neg liver ROS,,,  Endo/Other  negative endocrine ROS    Renal/GU negative Renal ROS  negative genitourinary   Musculoskeletal negative musculoskeletal ROS (+)    Abdominal   Peds negative pediatric ROS (+)  Hematology negative hematology ROS (+)   Anesthesia Other Findings   Reproductive/Obstetrics negative OB ROS                              Anesthesia Physical Anesthesia Plan  ASA: 2  Anesthesia Plan: General   Post-op Pain Management:    Induction: Intravenous  PONV Risk Score and Plan:   Airway Management Planned: Nasal Cannula  Additional Equipment:   Intra-op Plan:   Post-operative Plan:   Informed Consent: I have reviewed the patients History and Physical, chart, labs and discussed the procedure including the risks, benefits and alternatives for the proposed anesthesia with the patient or authorized representative who has indicated his/her understanding and acceptance.     Dental advisory given  Plan Discussed with: CRNA  Anesthesia Plan Comments:          Anesthesia Quick Evaluation  "

## 2024-04-19 NOTE — Anesthesia Procedure Notes (Signed)
 Date/Time: 04/19/2024 7:41 AM  Performed by: Para Jerelene CROME, CRNAOxygen Delivery Method: Nasal cannula

## 2024-04-19 NOTE — Anesthesia Postprocedure Evaluation (Signed)
"   Anesthesia Post Note  Patient: Hadassah Umstead  Procedure(s) Performed: COLONOSCOPY POLYPECTOMY, INTESTINE  Patient location during evaluation: PACU Anesthesia Type: General Level of consciousness: awake and alert Pain management: pain level controlled Vital Signs Assessment: post-procedure vital signs reviewed and stable Respiratory status: spontaneous breathing, nonlabored ventilation, respiratory function stable and patient connected to nasal cannula oxygen Cardiovascular status: blood pressure returned to baseline and stable Postop Assessment: no apparent nausea or vomiting Anesthetic complications: no   No notable events documented.   Last Vitals:  Vitals:   04/19/24 0638 04/19/24 0823  BP: (!) 150/84 118/61  Pulse: 79 74  Resp: 20 20  Temp: 36.6 C 36.5 C  SpO2: 100% 100%    Last Pain:  Vitals:   04/19/24 0823  TempSrc: Oral  PainSc: 0-No pain                 Andrea Limes      "

## 2024-04-20 ENCOUNTER — Encounter (INDEPENDENT_AMBULATORY_CARE_PROVIDER_SITE_OTHER): Payer: Self-pay | Admitting: *Deleted

## 2024-04-21 LAB — SURGICAL PATHOLOGY

## 2024-04-22 ENCOUNTER — Ambulatory Visit (INDEPENDENT_AMBULATORY_CARE_PROVIDER_SITE_OTHER): Payer: Self-pay | Admitting: Gastroenterology

## 2024-04-25 ENCOUNTER — Encounter (HOSPITAL_COMMUNITY): Payer: Self-pay | Admitting: Gastroenterology

## 2024-04-25 NOTE — Progress Notes (Signed)
 3 yr TCS noted in recall Patient result letter mailed procedure note and pathology result faxed to PCP

## 2024-04-28 ENCOUNTER — Encounter (INDEPENDENT_AMBULATORY_CARE_PROVIDER_SITE_OTHER): Payer: Self-pay | Admitting: Gastroenterology
